# Patient Record
Sex: Female | Born: 1960 | Race: Black or African American | Hispanic: No | Smoking: Never smoker
Health system: Southern US, Community
[De-identification: ages and names within clinical notes are randomized; demographics above are authoritative.]

## PROBLEM LIST (undated history)

## (undated) DIAGNOSIS — D259 Leiomyoma of uterus, unspecified: Secondary | ICD-10-CM

## (undated) DIAGNOSIS — E663 Overweight: Secondary | ICD-10-CM

## (undated) DIAGNOSIS — H5210 Myopia, unspecified eye: Secondary | ICD-10-CM

## (undated) DIAGNOSIS — F411 Generalized anxiety disorder: Secondary | ICD-10-CM

## (undated) DIAGNOSIS — M797 Fibromyalgia: Secondary | ICD-10-CM

## (undated) DIAGNOSIS — M5430 Sciatica, unspecified side: Secondary | ICD-10-CM

## (undated) DIAGNOSIS — L74519 Primary focal hyperhidrosis, unspecified: Secondary | ICD-10-CM

## (undated) DIAGNOSIS — L708 Other acne: Secondary | ICD-10-CM

## (undated) DIAGNOSIS — H3560 Retinal hemorrhage, unspecified eye: Secondary | ICD-10-CM

## (undated) DIAGNOSIS — I10 Essential (primary) hypertension: Secondary | ICD-10-CM

## (undated) DIAGNOSIS — F329 Major depressive disorder, single episode, unspecified: Secondary | ICD-10-CM

## (undated) DIAGNOSIS — L719 Rosacea, unspecified: Secondary | ICD-10-CM

## (undated) HISTORY — DX: Primary focal hyperhidrosis, unspecified: L74.519

## (undated) HISTORY — DX: Major depressive disorder, single episode, unspecified: F32.9

## (undated) HISTORY — DX: Overweight: E66.3

## (undated) HISTORY — DX: Essential (primary) hypertension: I10

## (undated) HISTORY — DX: Sciatica, unspecified side: M54.30

## (undated) HISTORY — DX: Other acne: L70.8

## (undated) HISTORY — DX: Leiomyoma of uterus, unspecified: D25.9

## (undated) HISTORY — DX: Myopia, unspecified eye: H52.10

## (undated) HISTORY — DX: Generalized anxiety disorder: F41.1

## (undated) HISTORY — DX: Retinal hemorrhage, unspecified eye: H35.60

## (undated) HISTORY — DX: Fibromyalgia: M79.7

## (undated) HISTORY — DX: Rosacea, unspecified: L71.9

---

## 1998-04-16 ENCOUNTER — Emergency Department (HOSPITAL_COMMUNITY): Admission: EM | Admit: 1998-04-16 | Discharge: 1998-04-16 | Payer: Self-pay | Admitting: Emergency Medicine

## 1998-10-02 ENCOUNTER — Emergency Department (HOSPITAL_COMMUNITY): Admission: EM | Admit: 1998-10-02 | Discharge: 1998-10-02 | Payer: Self-pay | Admitting: Emergency Medicine

## 1999-03-21 ENCOUNTER — Emergency Department (HOSPITAL_COMMUNITY): Admission: EM | Admit: 1999-03-21 | Discharge: 1999-03-21 | Payer: Self-pay | Admitting: Emergency Medicine

## 1999-03-21 ENCOUNTER — Encounter: Payer: Self-pay | Admitting: Emergency Medicine

## 1999-05-06 ENCOUNTER — Emergency Department (HOSPITAL_COMMUNITY): Admission: EM | Admit: 1999-05-06 | Discharge: 1999-05-06 | Payer: Self-pay | Admitting: Emergency Medicine

## 1999-06-05 ENCOUNTER — Emergency Department (HOSPITAL_COMMUNITY): Admission: EM | Admit: 1999-06-05 | Discharge: 1999-06-05 | Payer: Self-pay | Admitting: Emergency Medicine

## 1999-06-05 ENCOUNTER — Encounter: Payer: Self-pay | Admitting: Emergency Medicine

## 1999-08-19 ENCOUNTER — Emergency Department (HOSPITAL_COMMUNITY): Admission: EM | Admit: 1999-08-19 | Discharge: 1999-08-19 | Payer: Self-pay | Admitting: *Deleted

## 1999-09-10 ENCOUNTER — Emergency Department (HOSPITAL_COMMUNITY): Admission: EM | Admit: 1999-09-10 | Discharge: 1999-09-10 | Payer: Self-pay | Admitting: Emergency Medicine

## 1999-09-17 ENCOUNTER — Encounter: Payer: Self-pay | Admitting: Physical Medicine and Rehabilitation

## 1999-09-17 ENCOUNTER — Ambulatory Visit (HOSPITAL_COMMUNITY)
Admission: RE | Admit: 1999-09-17 | Discharge: 1999-09-17 | Payer: Self-pay | Admitting: Physical Medicine and Rehabilitation

## 1999-11-02 ENCOUNTER — Emergency Department (HOSPITAL_COMMUNITY): Admission: EM | Admit: 1999-11-02 | Discharge: 1999-11-02 | Payer: Self-pay | Admitting: Emergency Medicine

## 2000-02-09 ENCOUNTER — Emergency Department (HOSPITAL_COMMUNITY): Admission: EM | Admit: 2000-02-09 | Discharge: 2000-02-09 | Payer: Self-pay | Admitting: *Deleted

## 2000-03-19 ENCOUNTER — Emergency Department (HOSPITAL_COMMUNITY): Admission: EM | Admit: 2000-03-19 | Discharge: 2000-03-19 | Payer: Self-pay | Admitting: Emergency Medicine

## 2000-05-13 ENCOUNTER — Emergency Department (HOSPITAL_COMMUNITY): Admission: EM | Admit: 2000-05-13 | Discharge: 2000-05-13 | Payer: Self-pay | Admitting: Emergency Medicine

## 2002-03-16 ENCOUNTER — Emergency Department (HOSPITAL_COMMUNITY): Admission: EM | Admit: 2002-03-16 | Discharge: 2002-03-16 | Payer: Self-pay | Admitting: Emergency Medicine

## 2002-07-27 ENCOUNTER — Emergency Department (HOSPITAL_COMMUNITY): Admission: EM | Admit: 2002-07-27 | Discharge: 2002-07-27 | Payer: Self-pay

## 2002-09-27 ENCOUNTER — Emergency Department (HOSPITAL_COMMUNITY): Admission: EM | Admit: 2002-09-27 | Discharge: 2002-09-28 | Payer: Self-pay | Admitting: Emergency Medicine

## 2003-01-11 ENCOUNTER — Encounter: Admission: RE | Admit: 2003-01-11 | Discharge: 2003-02-13 | Payer: Self-pay | Admitting: Occupational Medicine

## 2003-02-09 ENCOUNTER — Emergency Department (HOSPITAL_COMMUNITY): Admission: EM | Admit: 2003-02-09 | Discharge: 2003-02-09 | Payer: Self-pay | Admitting: Emergency Medicine

## 2005-05-13 ENCOUNTER — Ambulatory Visit (HOSPITAL_COMMUNITY): Admission: RE | Admit: 2005-05-13 | Discharge: 2005-05-13 | Payer: Self-pay | Admitting: Internal Medicine

## 2005-05-13 ENCOUNTER — Ambulatory Visit: Payer: Self-pay | Admitting: Internal Medicine

## 2005-06-02 ENCOUNTER — Ambulatory Visit (HOSPITAL_COMMUNITY): Admission: RE | Admit: 2005-06-02 | Discharge: 2005-06-02 | Payer: Self-pay | Admitting: Internal Medicine

## 2005-07-30 ENCOUNTER — Ambulatory Visit: Payer: Self-pay | Admitting: Internal Medicine

## 2006-03-05 ENCOUNTER — Ambulatory Visit: Payer: Self-pay | Admitting: Internal Medicine

## 2006-03-05 ENCOUNTER — Encounter (INDEPENDENT_AMBULATORY_CARE_PROVIDER_SITE_OTHER): Payer: Self-pay | Admitting: Internal Medicine

## 2006-08-09 ENCOUNTER — Encounter (INDEPENDENT_AMBULATORY_CARE_PROVIDER_SITE_OTHER): Payer: Self-pay | Admitting: Internal Medicine

## 2006-08-09 DIAGNOSIS — H5210 Myopia, unspecified eye: Secondary | ICD-10-CM

## 2006-08-09 DIAGNOSIS — L719 Rosacea, unspecified: Secondary | ICD-10-CM | POA: Insufficient documentation

## 2006-08-09 DIAGNOSIS — L708 Other acne: Secondary | ICD-10-CM

## 2006-08-09 DIAGNOSIS — D259 Leiomyoma of uterus, unspecified: Secondary | ICD-10-CM

## 2006-08-09 HISTORY — DX: Myopia, unspecified eye: H52.10

## 2006-08-09 HISTORY — DX: Rosacea, unspecified: L71.9

## 2006-08-09 HISTORY — DX: Other acne: L70.8

## 2006-08-18 ENCOUNTER — Encounter (INDEPENDENT_AMBULATORY_CARE_PROVIDER_SITE_OTHER): Payer: Self-pay | Admitting: Internal Medicine

## 2006-08-18 ENCOUNTER — Ambulatory Visit: Payer: Self-pay | Admitting: Hospitalist

## 2006-08-18 LAB — CONVERTED CEMR LAB
BUN: 8 mg/dL (ref 6–23)
CO2: 27 meq/L (ref 19–32)
Calcium: 9 mg/dL (ref 8.4–10.5)
Chloride: 105 meq/L (ref 96–112)
Creatinine, Ser: 0.92 mg/dL (ref 0.40–1.20)
FSH: 36 milliintl units/mL
Free T4: 0.87 ng/dL — ABNORMAL LOW (ref 0.89–1.80)
Hemoglobin: 13.5 g/dL (ref 11.7–14.8)
LH: 30.3 milliintl units/mL
Lymphs Abs: 1.6 10*3/uL (ref 0.8–3.1)
MCV: 86 fL (ref 78.8–100.0)
Monocytes Absolute: 0.4 10*3/uL (ref 0.2–0.7)
Monocytes Relative: 7 % (ref 3–11)
Neutro Abs: 4.3 10*3/uL (ref 1.8–6.8)
Neutrophils Relative %: 65 % (ref 47–77)
RBC: 4.72 M/uL (ref 3.79–4.96)
TSH: 1.078 microintl units/mL (ref 0.350–5.50)
WBC: 6.5 10*3/uL (ref 3.7–10.0)

## 2006-08-23 ENCOUNTER — Ambulatory Visit: Payer: Self-pay | Admitting: Internal Medicine

## 2006-09-22 DIAGNOSIS — L74519 Primary focal hyperhidrosis, unspecified: Secondary | ICD-10-CM

## 2006-09-22 HISTORY — DX: Primary focal hyperhidrosis, unspecified: L74.519

## 2006-09-29 ENCOUNTER — Ambulatory Visit (HOSPITAL_COMMUNITY): Admission: RE | Admit: 2006-09-29 | Discharge: 2006-09-29 | Payer: Self-pay | Admitting: Obstetrics and Gynecology

## 2006-09-29 ENCOUNTER — Encounter (INDEPENDENT_AMBULATORY_CARE_PROVIDER_SITE_OTHER): Payer: Self-pay | Admitting: Internal Medicine

## 2007-03-24 ENCOUNTER — Encounter (INDEPENDENT_AMBULATORY_CARE_PROVIDER_SITE_OTHER): Payer: Self-pay | Admitting: Internal Medicine

## 2007-03-24 ENCOUNTER — Ambulatory Visit: Payer: Self-pay | Admitting: Internal Medicine

## 2007-03-24 LAB — CONVERTED CEMR LAB

## 2007-04-12 ENCOUNTER — Ambulatory Visit: Payer: Self-pay | Admitting: Internal Medicine

## 2007-04-12 DIAGNOSIS — I10 Essential (primary) hypertension: Secondary | ICD-10-CM | POA: Insufficient documentation

## 2007-04-13 ENCOUNTER — Encounter (INDEPENDENT_AMBULATORY_CARE_PROVIDER_SITE_OTHER): Payer: Self-pay | Admitting: Internal Medicine

## 2007-04-15 ENCOUNTER — Encounter (INDEPENDENT_AMBULATORY_CARE_PROVIDER_SITE_OTHER): Payer: Self-pay | Admitting: Internal Medicine

## 2007-07-18 ENCOUNTER — Encounter (INDEPENDENT_AMBULATORY_CARE_PROVIDER_SITE_OTHER): Payer: Self-pay | Admitting: Internal Medicine

## 2007-07-18 ENCOUNTER — Ambulatory Visit (HOSPITAL_COMMUNITY): Admission: RE | Admit: 2007-07-18 | Discharge: 2007-07-18 | Payer: Self-pay | Admitting: Infectious Diseases

## 2007-07-18 ENCOUNTER — Ambulatory Visit: Payer: Self-pay | Admitting: Infectious Diseases

## 2007-07-18 DIAGNOSIS — M5430 Sciatica, unspecified side: Secondary | ICD-10-CM

## 2007-07-18 HISTORY — DX: Sciatica, unspecified side: M54.30

## 2007-08-29 ENCOUNTER — Emergency Department (HOSPITAL_COMMUNITY): Admission: EM | Admit: 2007-08-29 | Discharge: 2007-08-29 | Payer: Self-pay | Admitting: Family Medicine

## 2007-12-21 ENCOUNTER — Encounter (INDEPENDENT_AMBULATORY_CARE_PROVIDER_SITE_OTHER): Payer: Self-pay | Admitting: Internal Medicine

## 2007-12-21 ENCOUNTER — Ambulatory Visit: Payer: Self-pay | Admitting: *Deleted

## 2007-12-21 LAB — CONVERTED CEMR LAB
ALT: 9 units/L (ref 0–35)
AST: 15 units/L (ref 0–37)
Alkaline Phosphatase: 78 units/L (ref 39–117)
Cholesterol: 186 mg/dL (ref 0–200)
Creatinine, Ser: 0.96 mg/dL (ref 0.40–1.20)
Total Bilirubin: 0.5 mg/dL (ref 0.3–1.2)
Total CHOL/HDL Ratio: 2.9
VLDL: 12 mg/dL (ref 0–40)

## 2007-12-22 ENCOUNTER — Ambulatory Visit (HOSPITAL_COMMUNITY): Admission: RE | Admit: 2007-12-22 | Discharge: 2007-12-22 | Payer: Self-pay | Admitting: *Deleted

## 2008-01-10 IMAGING — CR DG LUMBAR SPINE COMPLETE 4+V
5 series · 5 of 5 positions shown · non-contrast
Comparison: None.

CLINICAL DATA: Low back pain.
 LUMBAR SPINE - 4 VIEW:

[t l-spine a.p.]
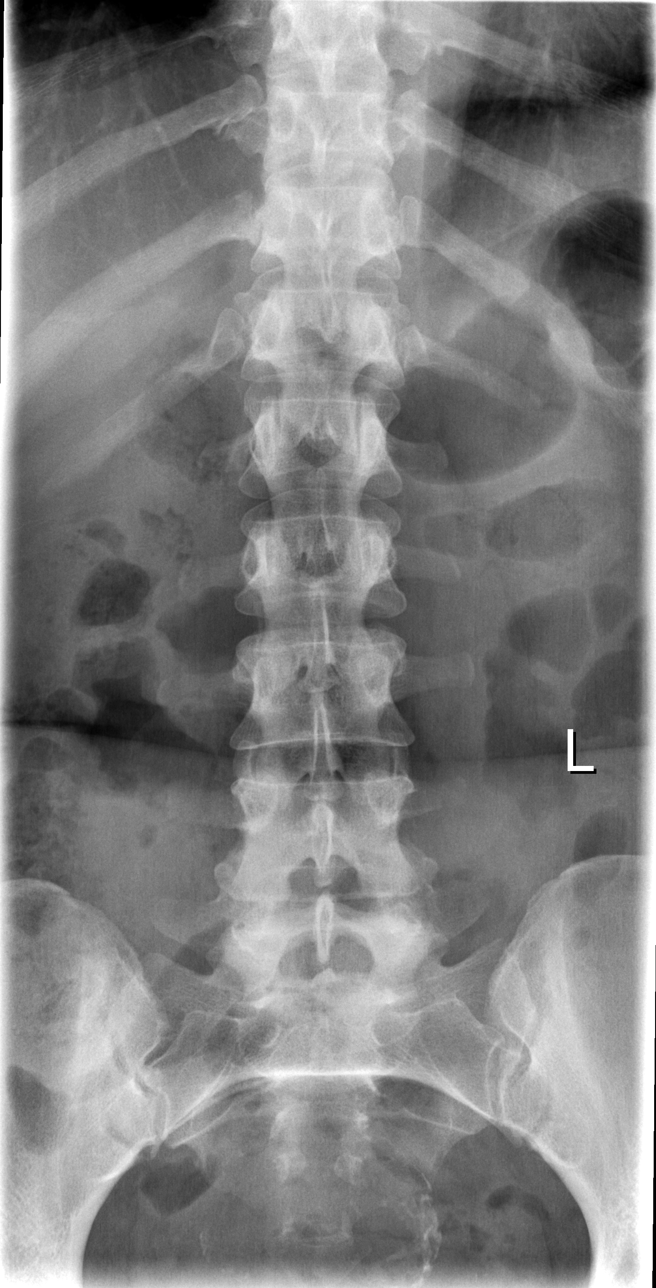

[t l-spine oblique exposure (1 of 2)]
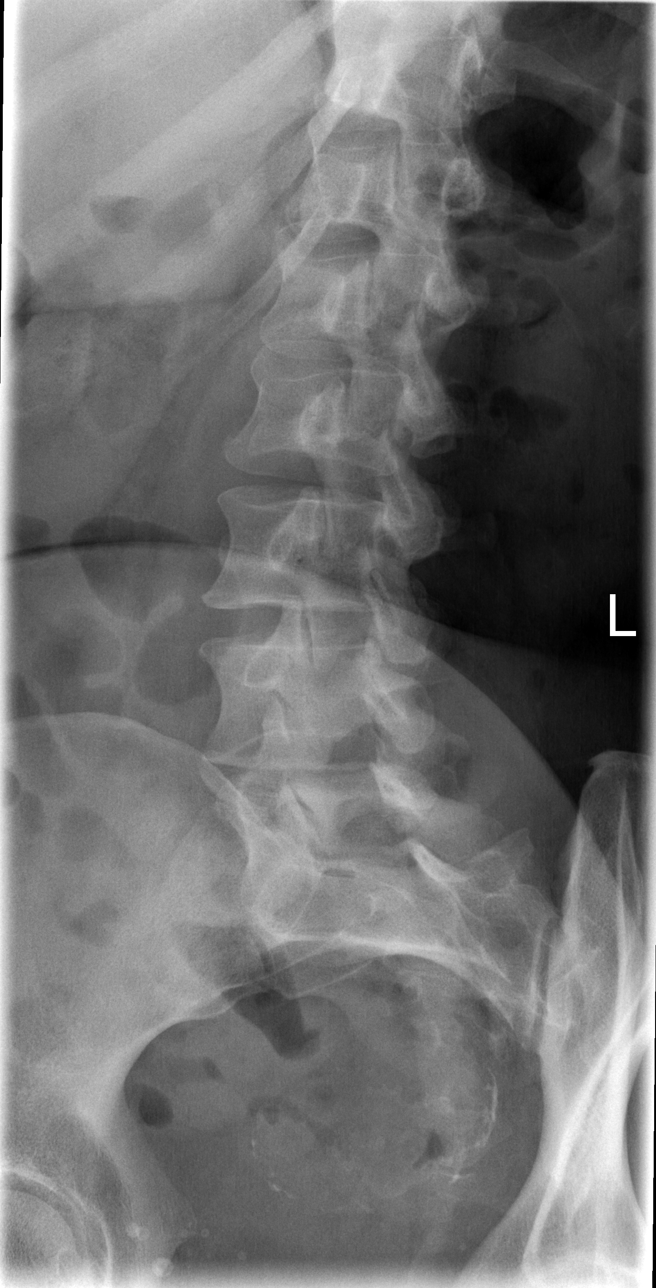

[t l-spine oblique exposure (2 of 2)]
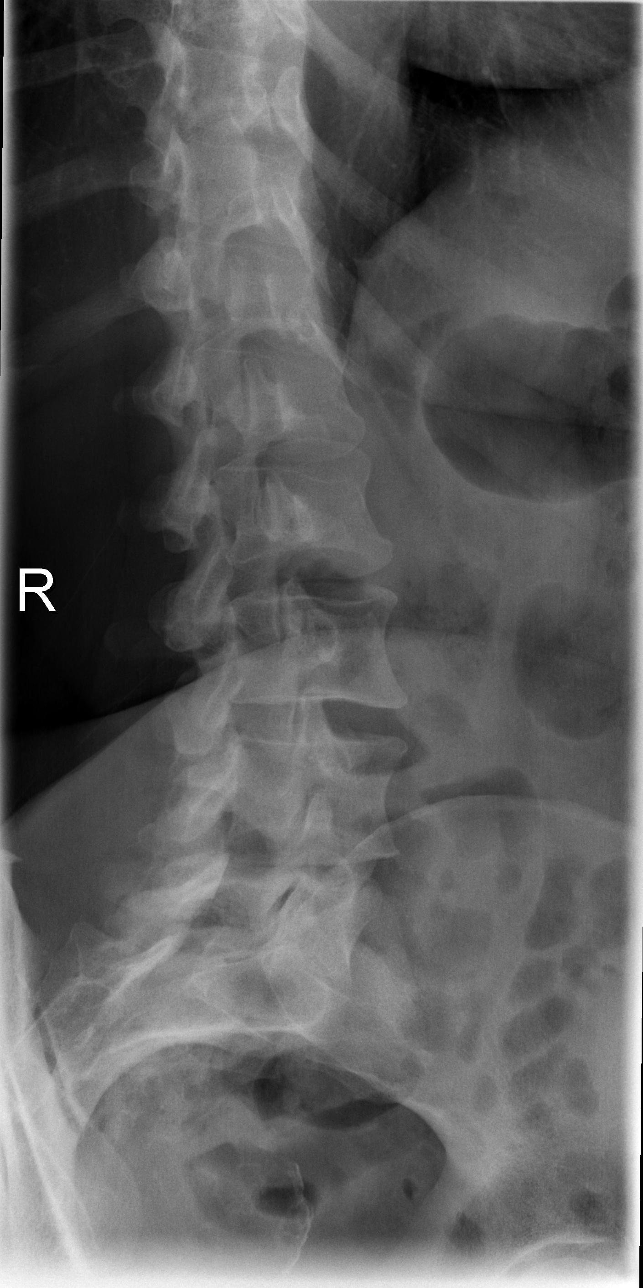

[t l-spine lat]
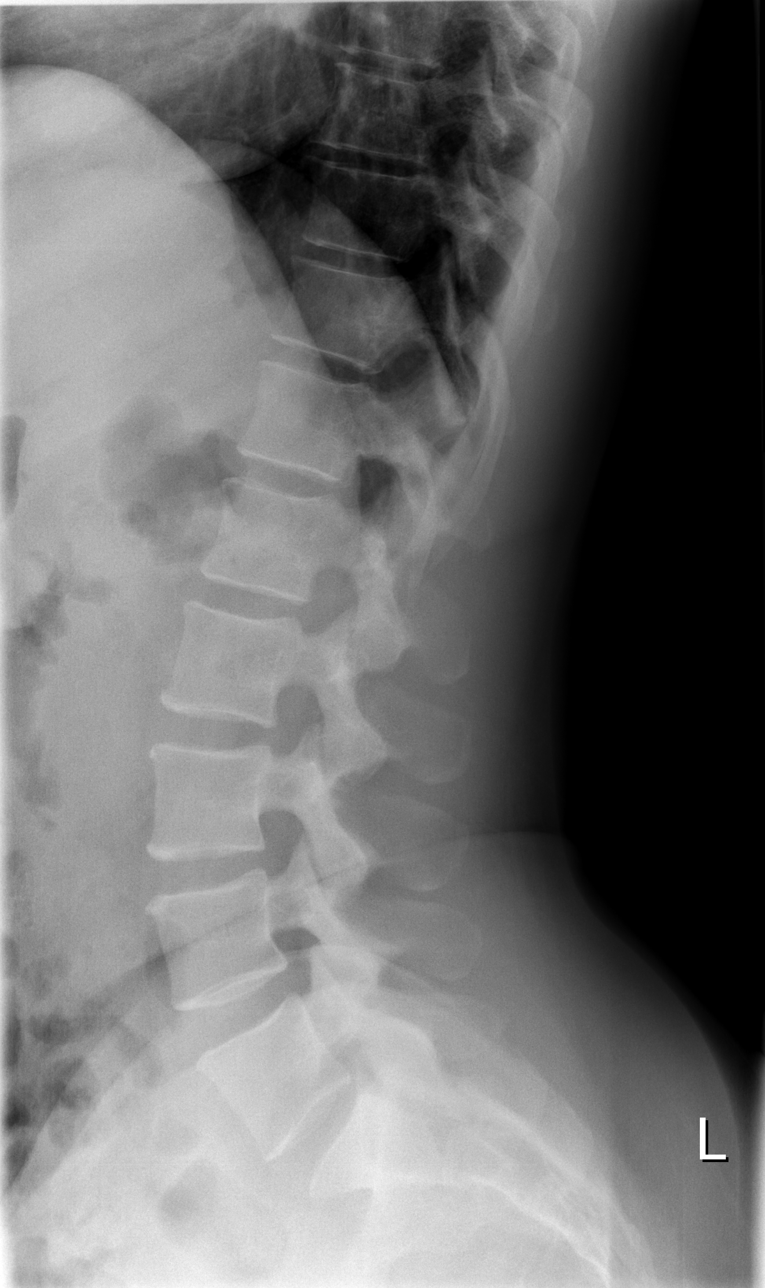

[t l-spine l5-s1 spot]
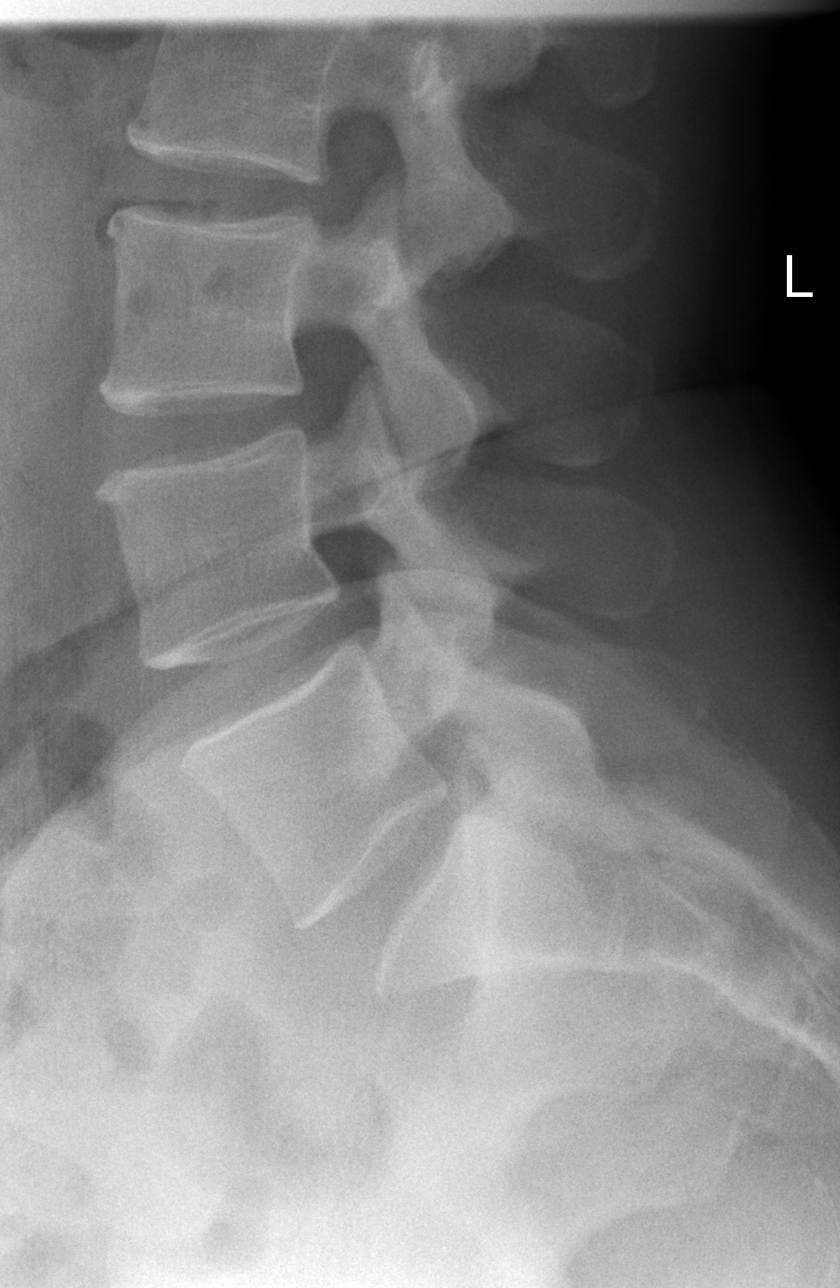

[5 of 5 positions shown; findings below may reference images not displayed]

FINDINGS: Alignment is anatomic. Vertebral body and disc space height are maintained. No significant degenerative changes.
IMPRESSION: No acute findings.

## 2008-01-12 ENCOUNTER — Ambulatory Visit (HOSPITAL_COMMUNITY): Admission: RE | Admit: 2008-01-12 | Discharge: 2008-01-12 | Payer: Self-pay | Admitting: *Deleted

## 2008-01-12 ENCOUNTER — Ambulatory Visit (HOSPITAL_COMMUNITY): Admission: RE | Admit: 2008-01-12 | Discharge: 2008-01-12 | Payer: Self-pay | Admitting: Internal Medicine

## 2008-02-08 ENCOUNTER — Ambulatory Visit: Payer: Self-pay | Admitting: *Deleted

## 2008-04-12 ENCOUNTER — Ambulatory Visit: Payer: Self-pay | Admitting: Internal Medicine

## 2008-04-12 ENCOUNTER — Encounter (INDEPENDENT_AMBULATORY_CARE_PROVIDER_SITE_OTHER): Payer: Self-pay | Admitting: Internal Medicine

## 2008-04-12 DIAGNOSIS — R635 Abnormal weight gain: Secondary | ICD-10-CM | POA: Insufficient documentation

## 2008-04-12 LAB — CONVERTED CEMR LAB: Candida species: NEGATIVE

## 2008-04-16 ENCOUNTER — Telehealth (INDEPENDENT_AMBULATORY_CARE_PROVIDER_SITE_OTHER): Payer: Self-pay | Admitting: Internal Medicine

## 2008-04-18 ENCOUNTER — Encounter: Payer: Self-pay | Admitting: Internal Medicine

## 2008-04-18 ENCOUNTER — Ambulatory Visit: Payer: Self-pay | Admitting: Internal Medicine

## 2008-04-18 LAB — CONVERTED CEMR LAB
Rubella: 47.4 intl units/mL — ABNORMAL HIGH
Varicella-Zoster Ab, IgM: <0.91 (ref <0.91)

## 2008-04-26 LAB — CONVERTED CEMR LAB: GC Probe Amp, Genital: NEGATIVE

## 2008-08-09 ENCOUNTER — Ambulatory Visit (HOSPITAL_COMMUNITY): Admission: RE | Admit: 2008-08-09 | Discharge: 2008-08-09 | Payer: Self-pay | Admitting: Internal Medicine

## 2008-08-09 ENCOUNTER — Encounter (INDEPENDENT_AMBULATORY_CARE_PROVIDER_SITE_OTHER): Payer: Self-pay | Admitting: Internal Medicine

## 2008-08-09 ENCOUNTER — Ambulatory Visit: Payer: Self-pay | Admitting: Internal Medicine

## 2008-08-09 DIAGNOSIS — IMO0001 Reserved for inherently not codable concepts without codable children: Secondary | ICD-10-CM

## 2008-08-09 LAB — CONVERTED CEMR LAB
BUN: 12 mg/dL (ref 6–23)
Bilirubin Urine: NEGATIVE
CO2: 24 meq/L (ref 19–32)
Calcium: 9.4 mg/dL (ref 8.4–10.5)
Creatinine, Ser: 1 mg/dL (ref 0.40–1.20)
Glucose, Bld: 88 mg/dL (ref 70–99)
Hemoglobin, Urine: NEGATIVE
Hemoglobin: 13.9 g/dL (ref 12.0–15.0)
Ketones, ur: NEGATIVE mg/dL
MCHC: 34.3 g/dL (ref 30.0–36.0)
MCV: 83.9 fL (ref 78.0–100.0)
Microalb, Ur: 0.27 mg/dL (ref 0.00–1.89)
Protein, ur: NEGATIVE mg/dL
RBC: 4.83 M/uL (ref 3.87–5.11)
Sed Rate: 8 mm/hr (ref 0–22)
Urine Glucose: NEGATIVE mg/dL

## 2008-08-14 ENCOUNTER — Emergency Department (HOSPITAL_BASED_OUTPATIENT_CLINIC_OR_DEPARTMENT_OTHER): Admission: EM | Admit: 2008-08-14 | Discharge: 2008-08-14 | Payer: Self-pay | Admitting: Emergency Medicine

## 2008-08-29 ENCOUNTER — Encounter (INDEPENDENT_AMBULATORY_CARE_PROVIDER_SITE_OTHER): Payer: Self-pay | Admitting: Internal Medicine

## 2008-08-29 ENCOUNTER — Ambulatory Visit: Payer: Self-pay | Admitting: Internal Medicine

## 2008-08-29 LAB — CONVERTED CEMR LAB
CO2: 28 meq/L (ref 19–32)
Calcium: 9.2 mg/dL (ref 8.4–10.5)
Creatinine, Ser: 1.1 mg/dL (ref 0.40–1.20)
Glucose, Bld: 96 mg/dL (ref 70–99)

## 2008-08-31 ENCOUNTER — Telehealth (INDEPENDENT_AMBULATORY_CARE_PROVIDER_SITE_OTHER): Payer: Self-pay | Admitting: Internal Medicine

## 2008-11-30 ENCOUNTER — Encounter (INDEPENDENT_AMBULATORY_CARE_PROVIDER_SITE_OTHER): Payer: Self-pay | Admitting: Internal Medicine

## 2009-01-02 ENCOUNTER — Ambulatory Visit: Payer: Self-pay | Admitting: Internal Medicine

## 2009-01-15 ENCOUNTER — Ambulatory Visit (HOSPITAL_COMMUNITY): Admission: RE | Admit: 2009-01-15 | Discharge: 2009-01-15 | Payer: Self-pay | Admitting: Internal Medicine

## 2009-03-06 ENCOUNTER — Telehealth: Payer: Self-pay | Admitting: *Deleted

## 2009-04-15 ENCOUNTER — Ambulatory Visit (HOSPITAL_COMMUNITY): Admission: RE | Admit: 2009-04-15 | Discharge: 2009-04-15 | Payer: Self-pay | Admitting: Specialist

## 2009-05-29 ENCOUNTER — Telehealth (INDEPENDENT_AMBULATORY_CARE_PROVIDER_SITE_OTHER): Payer: Self-pay | Admitting: Internal Medicine

## 2009-06-06 ENCOUNTER — Telehealth (INDEPENDENT_AMBULATORY_CARE_PROVIDER_SITE_OTHER): Payer: Self-pay | Admitting: Internal Medicine

## 2009-08-21 ENCOUNTER — Ambulatory Visit: Payer: Self-pay | Admitting: Infectious Disease

## 2009-08-21 DIAGNOSIS — M542 Cervicalgia: Secondary | ICD-10-CM

## 2009-10-01 ENCOUNTER — Telehealth (INDEPENDENT_AMBULATORY_CARE_PROVIDER_SITE_OTHER): Payer: Self-pay | Admitting: Internal Medicine

## 2009-10-11 ENCOUNTER — Telehealth (INDEPENDENT_AMBULATORY_CARE_PROVIDER_SITE_OTHER): Payer: Self-pay | Admitting: Internal Medicine

## 2009-10-23 ENCOUNTER — Ambulatory Visit: Payer: Self-pay | Admitting: Internal Medicine

## 2009-10-23 DIAGNOSIS — R300 Dysuria: Secondary | ICD-10-CM

## 2009-10-23 DIAGNOSIS — E663 Overweight: Secondary | ICD-10-CM | POA: Insufficient documentation

## 2009-10-23 LAB — CONVERTED CEMR LAB
Bilirubin Urine: NEGATIVE
Nitrite: NEGATIVE
Protein, U semiquant: NEGATIVE
Specific Gravity, Urine: 1.02

## 2009-10-24 LAB — CONVERTED CEMR LAB
CO2: 26 meq/L (ref 19–32)
Calcium: 9.2 mg/dL (ref 8.4–10.5)
Casts: NONE SEEN /lpf
Chloride: 107 meq/L (ref 96–112)
Creatinine, Ser: 1.01 mg/dL (ref 0.40–1.20)
Glucose, Bld: 84 mg/dL (ref 70–99)
Hemoglobin, Urine: NEGATIVE
Ketones, ur: NEGATIVE mg/dL
Nitrite: POSITIVE — AB
Protein, ur: NEGATIVE mg/dL
Sodium: 141 meq/L (ref 135–145)
Total Bilirubin: 0.3 mg/dL (ref 0.3–1.2)
Total Protein: 7.3 g/dL (ref 6.0–8.3)
Urobilinogen, UA: 0.2 (ref 0.0–1.0)
pH: 5.5 (ref 5.0–8.0)

## 2009-10-28 ENCOUNTER — Encounter (INDEPENDENT_AMBULATORY_CARE_PROVIDER_SITE_OTHER): Payer: Self-pay | Admitting: Internal Medicine

## 2009-12-03 ENCOUNTER — Ambulatory Visit (HOSPITAL_COMMUNITY): Admission: RE | Admit: 2009-12-03 | Discharge: 2009-12-03 | Payer: Self-pay | Admitting: Gastroenterology

## 2009-12-10 ENCOUNTER — Encounter (INDEPENDENT_AMBULATORY_CARE_PROVIDER_SITE_OTHER): Payer: Self-pay | Admitting: Internal Medicine

## 2010-01-01 ENCOUNTER — Telehealth: Payer: Self-pay

## 2010-01-31 ENCOUNTER — Encounter
Admission: RE | Admit: 2010-01-31 | Discharge: 2010-05-01 | Payer: Self-pay | Admitting: Physical Medicine & Rehabilitation

## 2010-02-04 ENCOUNTER — Encounter (INDEPENDENT_AMBULATORY_CARE_PROVIDER_SITE_OTHER): Payer: Self-pay | Admitting: Internal Medicine

## 2010-02-05 ENCOUNTER — Encounter (INDEPENDENT_AMBULATORY_CARE_PROVIDER_SITE_OTHER): Payer: Self-pay | Admitting: Internal Medicine

## 2010-02-06 ENCOUNTER — Ambulatory Visit (HOSPITAL_COMMUNITY): Admission: RE | Admit: 2010-02-06 | Discharge: 2010-02-06 | Payer: Self-pay | Admitting: Internal Medicine

## 2010-03-18 ENCOUNTER — Ambulatory Visit: Payer: Self-pay | Admitting: Physical Medicine & Rehabilitation

## 2010-06-10 ENCOUNTER — Ambulatory Visit: Payer: Self-pay | Admitting: Internal Medicine

## 2010-06-10 DIAGNOSIS — H5710 Ocular pain, unspecified eye: Secondary | ICD-10-CM

## 2010-06-10 DIAGNOSIS — R1011 Right upper quadrant pain: Secondary | ICD-10-CM

## 2010-06-10 LAB — CONVERTED CEMR LAB
ALT: 11 units/L (ref 0–35)
AST: 18 units/L (ref 0–37)
Albumin: 4.7 g/dL (ref 3.5–5.2)
Alkaline Phosphatase: 71 units/L (ref 39–117)
BUN: 15 mg/dL (ref 6–23)
Basophils Absolute: 0.1 10*3/uL (ref 0.0–0.1)
Basophils Relative: 1 % (ref 0–1)
Blood in Urine, dipstick: NEGATIVE
Calcium: 9.2 mg/dL (ref 8.4–10.5)
Chloride: 106 meq/L (ref 96–112)
Cholesterol: 187 mg/dL (ref 0–200)
Eosinophils Relative: 3 % (ref 0–5)
Glucose, Urine, Semiquant: NEGATIVE
HDL: 49 mg/dL (ref >39)
Hemoglobin: 13.7 g/dL (ref 12.0–15.0)
Hgb A1c MFr Bld: 5.7 %
Ketones, urine, test strip: NEGATIVE
Lymphocytes Relative: 27 % (ref 12–46)
MCHC: 33.8 g/dL (ref 30.0–36.0)
Monocytes Absolute: 0.4 10*3/uL (ref 0.1–1.0)
Neutro Abs: 4.5 10*3/uL (ref 1.7–7.7)
Platelets: 242 10*3/uL (ref 150–400)
Potassium: 4 meq/L (ref 3.5–5.3)
Protein, U semiquant: NEGATIVE
RDW: 13.1 % (ref 11.5–15.5)
Sodium: 142 meq/L (ref 135–145)
Total CHOL/HDL Ratio: 3.8
Urobilinogen, UA: 2
WBC Urine, dipstick: NEGATIVE

## 2010-06-13 ENCOUNTER — Ambulatory Visit (HOSPITAL_COMMUNITY): Admission: RE | Admit: 2010-06-13 | Discharge: 2010-06-13 | Payer: Self-pay | Admitting: Internal Medicine

## 2010-09-18 ENCOUNTER — Telehealth: Payer: Self-pay | Admitting: Internal Medicine

## 2010-10-09 ENCOUNTER — Telehealth: Payer: Self-pay | Admitting: Internal Medicine

## 2010-10-12 ENCOUNTER — Encounter: Payer: Self-pay | Admitting: Internal Medicine

## 2010-10-16 ENCOUNTER — Encounter: Payer: Self-pay | Admitting: Internal Medicine

## 2010-10-21 ENCOUNTER — Ambulatory Visit: Admission: RE | Admit: 2010-10-21 | Discharge: 2010-10-21 | Payer: Self-pay | Source: Home / Self Care

## 2010-10-21 DIAGNOSIS — H3560 Retinal hemorrhage, unspecified eye: Secondary | ICD-10-CM | POA: Insufficient documentation

## 2010-10-21 DIAGNOSIS — F411 Generalized anxiety disorder: Secondary | ICD-10-CM | POA: Insufficient documentation

## 2010-10-21 HISTORY — DX: Generalized anxiety disorder: F41.1

## 2010-10-21 NOTE — Letter (Signed)
Summary: [facility]-no showed  [facility]   Imported By: Margie Billet 02/04/2010 14:14:14  _____________________________________________________________________  External Attachment:    Type:   Image     Comment:   External Document

## 2010-10-21 NOTE — Assessment & Plan Note (Signed)
Summary: f/u ED, hypertension/pcp-bowers/hla   Vital Signs:  Patient profile:   50 year old female Height:      68 inches (172.72 cm) Weight:      190.04 pounds (86.38 kg) BMI:     29.00 Temp:     98 degrees F (36.67 degrees C) oral Pulse rate:   53 / minute BP sitting:   152 / 96  (right arm)  Vitals Entered By: Angelina Ok RN (June 10, 2010 2:22 PM) CC: Depression Is Patient Diabetic? No Pain Assessment Patient in pain? no      Nutritional Status BMI of 25 - 29 = overweight  Have you ever been in a relationship where you felt threatened, hurt or afraid?No  Comments Sharp pains in upper abdomen.  Ocassional after she eats.  Montly on right.  Goes from right to left.   Primary Care Provider:  Whitney Post MD  CC:  Depression.  History of Present Illness: 50 y/o woman with PMH of HTN and fibromyalgia comes to the clinic complaining of multiple propblems.   1. Abd pain- RUQ, more when she moves, some assoicatiation with food but she cannot be sure, sharp, last 5-10 minuts, resolves on its own, no medications, associated with nasuea, no vomiting, fever. GB still in place. no h/o GI surgery, no excessive NSAIDS use, no blood in stool or hemetemeis.   2. Eye pain- left eye, episodic, dull pain associated with headache and some tearing. no redness, no h/o glaucoma, no vision changes, no family h/o glaucoma. had a regualr eye check up 1 yr ago, the pain has been going on for few months,. no complaints on the other eye.   3. High BP- BP is running in 150-160 when she checks it herself.   Depression History:      The patient is having a depressed mood most of the day but denies diminished interest in her usual daily activities.        The patient denies that she feels like life is not worth living, denies that she wishes that she were dead, and denies that she has thought about ending her life.         Preventive Screening-Counseling & Management  Alcohol-Tobacco  Smoking Status: never     Passive Smoke Exposure: no  Current Medications (verified): 1)  Flexeril 10 Mg  Tabs (Cyclobenzaprine Hcl) .... Take One Tab Every 8 Hours As Needed For Muscle Spasm For Two Weeks 2)  Tylenol Extra Strength 500 Mg Tabs (Acetaminophen) .... Take 1 Tablet Every 8 Hour For Pain As Needed. 3)  Amitriptyline Hcl 50 Mg Tabs (Amitriptyline Hcl) .... Take One and One Half Tab Before Bedtime. 4)  Calcium 600/vitamin D 600-400 Mg-Unit  Tabs (Calcium Carbonate-Vitamin D) .... Take One Tablet Twice Daily. 5)  Lisinopril 40 Mg Tabs (Lisinopril) .... Take 1 Tablet By Mouth Once A Day 6)  Omeprazole 40 Mg Cpdr (Omeprazole) .... Take 1 Tablet By Mouth Once A Day  Allergies (verified): 1)  ! Ultram 2)  Sulfa 3)  Vicodin  Review of Systems       The patient complains of abdominal pain.  The patient denies anorexia, fever, weight loss, weight gain, vision loss, decreased hearing, hoarseness, chest pain, syncope, dyspnea on exertion, peripheral edema, prolonged cough, headaches, hemoptysis, melena, hematochezia, severe indigestion/heartburn, hematuria, incontinence, genital sores, muscle weakness, suspicious skin lesions, transient blindness, difficulty walking, depression, unusual weight change, abnormal bleeding, enlarged lymph nodes, angioedema, breast masses, and testicular masses.  Physical Exam  General:  alert, well hydrated, some discomofort and anxierty  Head:  normocephalic and atraumatic.   Eyes:  vision grossly intact, pupils equal, pupils round, pupils reactive to light, pupils react to accomodation, corneas and lenses clear, no injection, no iris abnormalities, and no optic disk abnormalities.  vision grossly intact, pupils equal, pupils round, pupils reactive to light, pupils react to accomodation, corneas and lenses clear, no injection, no iris abnormalities, and no optic disk abnormalities.   Ears:  R ear normal and L ear normal.  R ear normal and L ear normal.     Nose:  no external deformity.  no external deformity.   Mouth:  good dentition.  good dentition.   Neck:  supple, full ROM, and no masses.  supple, full ROM, and no masses.   Lungs:  normal respiratory effort, no intercostal retractions, no accessory muscle use, and normal breath sounds.  normal respiratory effort, no intercostal retractions, no accessory muscle use, and normal breath sounds.   Heart:  normal rate, regular rhythm, no murmur, and no gallop.  normal rate, regular rhythm, no murmur, and no gallop.   Abdomen:  soft, normal bowel sounds, epigastric tenderness, and RUQ tenderness.  soft and normal bowel sounds.   Msk:  normal ROM, no joint tenderness, and no joint swelling.  normal ROM, no joint tenderness, and no joint swelling.   Pulses:  dorsalis pedis pulses normal bilaterally  Neurologic:  OrientedX3, cranial nerver 2-12 intact,strength good in all extremities, sensations normal to light touch, reflexes 2+ b/l, gait normal    Impression & Recommendations:  Problem # 1:  ABDOMINAL PAIN RIGHT UPPER QUADRANT (ICD-789.01)  dd- gallstones Vs GERD Vs fibromylagia Vs appendicitis (less likely based on physical exam) musculoskeletal (aggravated with movement)   Plan- USG abd        - CMET       - CBC ( anemia, infection)       - UA ( dipstick negative)        - protonix      - follow up or return to ED if worse pain, fever, n/v or other compaints.  Orders: T-CMP with Estimated GFR ([id number]) T-CBC w/Diff ([id number]) T-Urinalysis Dipstick only (30160FU) T-Ultrasound Abdominal, Complete (93235)  Problem # 2:  EYE PAIN, LEFT (ICD-379.91)  dd- glaucoma Vs migraines vs anxiety   Plan- opthalmololgy referal for assessment.            Orders: Ophthalmology Referral (Ophthalmology)  Problem # 3:  HYPERTENSION (ICD-401.9) Suboptimal control  Plan:  - Change lisinopril tro 40 mg - FLP, HBA1c for risk stratification ( family h/o first degree relatives with DM)   Her  updated medication list for this problem includes:    Lisinopril 40 Mg Tabs (Lisinopril) .Marland Kitchen... Take 1 tablet by mouth once a day  Orders: T-Lipid Profile ([id number]) T-Hgb A1C (in-house) (70623JS)  BP today: 152/96 Prior BP: 128/88 (10/23/2009)  Labs Reviewed: K+: 4.0 (10/23/2009) Creat: : 1.01 (10/23/2009)   Chol: 186 (12/21/2007)   HDL: 64 (12/21/2007)   LDL: 110 (12/21/2007)   TG: 61 (12/21/2007)  Complete Medication List: 1)  Flexeril 10 Mg Tabs (Cyclobenzaprine hcl) .... Take one tab every 8 hours as needed for muscle spasm for two weeks 2)  Tylenol Extra Strength 500 Mg Tabs (Acetaminophen) .... Take 1 tablet every 8 hour for pain as needed. 3)  Amitriptyline Hcl 50 Mg Tabs (Amitriptyline hcl) .... Take one and one half tab before bedtime. 4)  Calcium  600/vitamin D 600-400 Mg-unit Tabs (Calcium carbonate-vitamin d) .... Take one tablet twice daily. 5)  Lisinopril 40 Mg Tabs (Lisinopril) .... Take 1 tablet by mouth once a day 6)  Omeprazole 40 Mg Cpdr (Omeprazole) .... Take 1 tablet by mouth once a day  Patient Instructions: 1)  Please schedule a follow-up appointment in 3 months. 2)  The radilogy department will call you with an appointment for ultrasound. 3)  Come to the Emergency room if the pain gets worse or you are having fever, nausea or vomiting.  4)  See an eye doctor as soon as you can  Prescriptions: OMEPRAZOLE 40 MG CPDR (OMEPRAZOLE) Take 1 tablet by mouth once a day  #31 x 3   Entered and Authorized by:   Bethel Born MD   Signed by:   Bethel Born MD on 06/10/2010   Method used:   Electronically to        [facility] # [phone]* (retail)       [address]       [place], [place]  [address]       Ph: [phone]       Fax: [phone]   RxID:   [id number] LISINOPRIL 40 MG TABS (LISINOPRIL) Take 1 tablet by mouth once a day  #31 x 3   Entered and Authorized by:   Bethel Born MD   Signed by:   Bethel Born MD on  06/10/2010   Method used:   Electronically to        [facility] # [phone]* (retail)       [address]       [place], [place]  [address]       Ph: [phone]       Fax: [phone]   RxID:   [id number]   Prevention & Chronic Care Immunizations   Influenza vaccine: Fluvax Non-MCR  (10/23/2009)    Tetanus booster: 04/18/2008: Td    Pneumococcal vaccine: Not documented  Colorectal Screening   Hemoccult: Negative  (08/23/2006)    Colonoscopy:  Results: Normal.   (12/03/2009)   Colonoscopy action/deferral: Repeat colonoscopy in 10 years.    (12/03/2009)  Other Screening   Pap smear: NEGATIVE FOR INTRAEPITHELIAL LESIONS OR MALIGNANCY.  (08/21/2009)   Pap smear action/deferral: Ordered  (08/21/2009)   Pap smear due: 04/2009    Mammogram: ASSESSMENT: Negative - BI-RADS 1^MM DIGITAL SCREENING  (02/06/2010)   Mammogram action/deferral: Screening mammogram in 1 year.     (01/12/2008)   Mammogram due: 09/2007   Smoking status: never  (06/10/2010)  Lipids   Total Cholesterol: 186  (12/21/2007)   LDL: 110  (12/21/2007)   LDL Direct: Not documented   HDL: 64  (12/21/2007)   Triglycerides: 61  (12/21/2007)  Hypertension   Last Blood Pressure: 152 / 96  (06/10/2010)   Serum creatinine: 1.01  (10/23/2009)   Serum potassium 4.0  (10/23/2009)  Self-Management Support :    Patient will work on the following items until the next clinic visit to reach self-care goals:     Medications and monitoring: take my medicines every day, bring all of my medications to every visit  (06/10/2010)     Eating: drink diet soda or water instead of juice or soda, eat more vegetables, use fresh or frozen vegetables, eat foods that are low in salt, eat baked foods instead  of fried foods, eat fruit for snacks and desserts, limit or avoid alcohol  (06/10/2010)     Activity: take a 30 minute walk every day  (06/10/2010)    Hypertension self-management support: Written  self-care plan, Education handout, Pre-printed educational material, Resources for patients handout  (06/10/2010)   Hypertension self-care plan printed.   Hypertension education handout printed      Resource handout printed.   Process Orders Check Orders Results:     [facility]: ABN not required for this insurance Tests Sent for requisitioning (June 10, 2010 3:30 PM):     06/10/2010: [facility] -- T-CMP with Estimated GFR [[id number]] (signed)     06/10/2010: [facility] -- T-CBC w/Diff [[id number]] (signed)     06/10/2010: [facility] -- T-Lipid Profile [id number] (signed)     Laboratory Results   Urine Tests  Date/Time Recieved: 06/10/2010 15:15 GH Date/Time Reported: 06/10/2010 15:17 GH  Routine Urinalysis   Color: lt. yellow Appearance: Hazy Glucose: negative   (Normal Range: Negative) Bilirubin: negative   (Normal Range: Negative) Ketone: negative   (Normal Range: Negative) Spec. Gravity: 1.015   (Normal Range: 1.003-1.035) Blood: negative   (Normal Range: Negative) pH: 7.0   (Normal Range: 5.0-8.0) Protein: negative   (Normal Range: Negative) Urobilinogen: 2.0   (Normal Range: 0-1) Nitrite: negative   (Normal Range: Negative) Leukocyte Esterace: negative   (Normal Range: Negative)

## 2010-10-21 NOTE — Letter (Signed)
Summary: [facility]: Referral  [facility]: Referral   Imported By: Florinda Marker 11/01/2009 14:55:30  _____________________________________________________________________  External Attachment:    Type:   Image     Comment:   External Document

## 2010-10-21 NOTE — Progress Notes (Signed)
Summary: Refill/gh  Phone Note Refill Request Message from:  Patient on October 01, 2009 12:08 PM  Refills Requested: Medication #1:  LISINOPRIL 10 MG TABS Take 1 tablet by mouth once a day 3  Medication #2:  Darvocet  Method Requested: Electronic Initial call taken by: Angelina Ok RN,  October 01, 2009 12:49 PM  Follow-up for Phone Call        The ibuprofen, flexeril, and amitriptyline should help w/ pain.  She can try over the counter tylenol if she needs something else.  Follow-up by: Joaquin Courts  MD,  October 01, 2009 2:08 PM  Additional Follow-up for Phone Call Additional follow up Details #1::        Pt says that Ibuprofen "messes" with her liver and that the other medications do not work for her.  Given option to take Tylenol.  Said that she will discuss pain medication witht Dr. Andrey Campanile at her next office visit. Additional Follow-up by: Angelina Ok RN,  October 01, 2009 2:35 PM    Prescriptions: LISINOPRIL 10 MG TABS (LISINOPRIL) Take 1 tablet by mouth once a day 3  #30 x 0   Entered and Authorized by:   Joaquin Courts  MD   Signed by:   Joaquin Courts  MD on 10/01/2009   Method used:   Electronically to        [facility] # [phone]* (retail)       [address]       [place], [place]  [address]       Ph: [phone]       Fax: [phone]   RxID:   [id number]

## 2010-10-21 NOTE — Assessment & Plan Note (Signed)
Summary: est-ck/fu/meds/cfb   Vital Signs:  Patient profile:   50 year old female Height:      68 inches (172.72 cm) Weight:      184.06 pounds (83.66 kg) BMI:     28.09 Temp:     97.8 degrees F (36.56 degrees C) oral Pulse rate:   72 / minute BP sitting:   128 / 88  (left arm)  Vitals Entered By: Angelina Ok RN (October 23, 2009 8:53 AM) CC: Depression Is Patient Diabetic? No Pain Assessment Patient in pain? yes     Location: shoulder, lower back Intensity: 6 Type: sharp, aching Nutritional Status BMI of 25 - 29 = overweight  Have you ever been in a relationship where you felt threatened, hurt or afraid?No  Comments ? UTI.  Has been going to bathroom.  Has a strong small.  Has a head ans sinus infection.  Clear to yellow mucous.  Had blistrs on the side of her mouth.  Sweats and chills 7-8 days ago.  Wants a Colonoscopy.  Problems turning her neck  Has had headaches.   Primary Care Provider:  Joaquin Courts  MD  CC:  Depression.  History of Present Illness: Pt is a 50 yo female w/ past med hx below here for routine f/u.  She notes a recnet cold w/ URI symptoms and fevers but that has now gotton better.  She c/o pain all over and relates it to her fibromyalgia.  She feels like she may havfe a UTI b/c the urine has an odor and has been cloudy.  She is interested in colonoscopy screening for colon ca.   Depression History:      The patient is having a depressed mood most of the day and has a diminished interest in her usual daily activities.  Positive alarm features for a manic disorder include excessive buying sprees.        The patient denies that she feels like life is not worth living, denies that she wishes that she were dead, and denies that she has thought about ending her life.        Comments:  In pain.   Preventive Screening-Counseling & Management  Alcohol-Tobacco     Smoking Status: never     Passive Smoke Exposure: no  Current Medications (verified): 1)   Metrogel  Gel (Metronidazole Gel) .... Apply To Face At Bedtime 2)  Differin 0.1 % Gel (Adapalene) .... Apply To Face At Bedtime 3)  Nicomide 750-25-1.5-0.5 Mg Tabs (Niacinamide-Zinc-Copper-Fa) .... Take 1 Tablet By Mouth Once A Day 4)  Drysol 20 % Soln (Aluminum Chloride) .... Apply To Affected Areas At Bedtime 5)  Flexeril 10 Mg  Tabs (Cyclobenzaprine Hcl) .... Take One Tab Every 8 Hours As Needed For Muscle Spasm For Two Weeks 6)  Zyrtec Allergy 10 Mg  Tabs (Cetirizine Hcl) .... Take One Tablet By Mouth Daily. 7)  Tylenol Extra Strength 500 Mg Tabs (Acetaminophen) .... Take 1 Tablet Every 8 Hour For Pain As Needed. 8)  Amitriptyline Hcl 50 Mg Tabs (Amitriptyline Hcl) .... Take One and One Half Tab Before Bedtime. 9)  Calcium 600/vitamin D 600-400 Mg-Unit  Tabs (Calcium Carbonate-Vitamin D) .... Take One Tablet Twice Daily. 10)  Lisinopril 10 Mg Tabs (Lisinopril) .... Take 1 Tablet By Mouth Once A Day 3  Allergies (verified): 1)  ! Ultram 2)  Sulfa 3)  Vicodin  Past History:  Past Medical History: Last updated: 08/29/2008 Rosacea Acne vulgaris Uterine fibroid Hypertension diagnosed 07/08-controls  with diet and exercise Chronic back pain Fibromyalgia: negative ESR, CPK, CRP, TSH, neg ANA and ENA panel, neg RF and neg HLAB-27. Pt has also had a negative MRI of the lumbar spine in 2009.  Normal CRP and ESR 11/09. Htn diagnosed 11/09  Past Surgical History: Last updated: 03/24/2007 S/P uterine fibroid embolectomy at [facility] Tubal ligation  Social History: Last updated: 04/18/2008 Attending to phlebotomyst school. Married Never Smoked Alcohol use-no Drug use-no  Risk Factors: Smoking Status: never (10/23/2009) Passive Smoke Exposure: no (10/23/2009)  Family History: Reviewed history from 03/24/2007 and no changes required. Mother:  DM Type 2, htn, osteoporosis Father:  Htn, died from aneurysm at age 42 SIster: CHF Sister:  DM  SIster:  Htn  Social  History: Reviewed history from 04/18/2008 and no changes required. Attending to [employer]lebotomyst school. Married Never Smoked Alcohol use-no Drug use-no  Review of Systems       As per HPI.   Physical Exam  General:  alert, well hydrated, no distress.  Eyes:  anicteric Ears:  TM's normal bilaterally, no significant cerumen Nose:  mild clear rhinorrhea Mouth:  mild pharyngeal erythema Neck:  no LAD Lungs:  Normal respiratory effort, chest expands symmetrically. Lungs are clear to auscultation, no crackles or wheezes. Heart:  Normal rate and regular rhythm. S1 and S2 normal without gallop, murmur, click, rub or other extra sounds. Abdomen:  soft, non-tender, normal bowel sounds, and no distention.   Msk:  no joint swelling Extremities:  no edema. Neurologic:  gait normal   Impression & Recommendations:  Problem # 1:  DYSURIA (ICD-788.1) Has some urinary complaints and trace blood/trace leuks on dipstick.  Will f/u UA and cx.  Nothing worrisome to suggest pyelo.  Cipro x 3 days.   Orders: T-Urinalysis ([id number]) T-Culture, Urine ([id number])  Her updated medication list for this problem includes:    Ciprofloxacin Hcl 250 Mg Tabs (Ciprofloxacin hcl) .Marland Kitchen... Take 1 tablet by mouth two times a day  Problem # 2:  HYPERTENSION (ICD-401.9) BP starting to trend up.  Cont ACE inh and discussed lifestyle strategies.  BMET today.  Her updated medication list for this problem includes:    Lisinopril 10 Mg Tabs (Lisinopril) .Marland Kitchen... Take 1 tablet by mouth once a day 3  Orders: T-Comprehensive Metabolic Panel ([id number])  BP today: 128/88 Prior BP: 122/87 (08/21/2009)  Labs Reviewed: K+: 3.7 (08/29/2008) Creat: : 1.10 (08/29/2008)   Chol: 186 (12/21/2007)   HDL: 64 (12/21/2007)   LDL: 110 (12/21/2007)   TG: 61 (12/21/2007)  Problem # 3:  FIBROMYALGIA (ICD-729.1) Still w/ continued pain all over.  Plans on trying stretching to see if this helps.  Cont flexeril, amitriptyline, and  tylenol.  The following medications were removed from the medication list:    Ibuprofen 400 Mg Tabs (Ibuprofen) .Marland Kitchen... Take 1 tablet every 8 hour for pain as needed. Her updated medication list for this problem includes:    Flexeril 10 Mg Tabs (Cyclobenzaprine hcl) .Marland Kitchen... Take one tab every 8 hours as needed for muscle spasm for two weeks    Tylenol Extra Strength 500 Mg Tabs (Acetaminophen) .Marland Kitchen... Take 1 tablet every 8 hour for pain as needed.  Problem # 4:  HEALTH MAINTENANCE EXAM (ICD-V70.0) Referral for colonoscopy placed.    Orders: T-HIV Antibody  (Reflex) ([id number]) Mammogram (Screening) (Mammo)  Problem # 5:  OVERWEIGHT (ICD-278.02) Discussed weight loss strategies.  Suggested Healthy Lifestyles Class.  Complete Medication List: 1)  Metrogel Gel (Metronidazole gel) .... Apply to face at  bedtime 2)  Differin 0.1 % Gel (Adapalene) .... Apply to face at bedtime 3)  Nicomide 750-25-1.5-0.5 Mg Tabs (Niacinamide-zinc-copper-fa) .... Take 1 tablet by mouth once a day 4)  Drysol 20 % Soln (Aluminum chloride) .... Apply to affected areas at bedtime 5)  Flexeril 10 Mg Tabs (Cyclobenzaprine hcl) .... Take one tab every 8 hours as needed for muscle spasm for two weeks 6)  Zyrtec Allergy 10 Mg Tabs (Cetirizine hcl) .... Take one tablet by mouth daily. 7)  Tylenol Extra Strength 500 Mg Tabs (Acetaminophen) .... Take 1 tablet every 8 hour for pain as needed. 8)  Amitriptyline Hcl 50 Mg Tabs (Amitriptyline hcl) .... Take one and one half tab before bedtime. 9)  Calcium 600/vitamin D 600-400 Mg-unit Tabs (Calcium carbonate-vitamin d) .... Take one tablet twice daily. 10)  Lisinopril 10 Mg Tabs (Lisinopril) .... Take 1 tablet by mouth once a day 3 11)  Ciprofloxacin Hcl 250 Mg Tabs (Ciprofloxacin hcl) .... Take 1 tablet by mouth two times a day  Other Orders: Gastroenterology Referral (GI) Influenza Vaccine NON MCR ([id number])  Patient Instructions: 1)  Please make a followup in 6 months. 2)   Please get your mammogram and colonoscopy done before your next visit. 3)  Call sooner if you need anything. 4)  Think about going to the Healthy Lifestyles Class for tips on weight loss. 5)  You will be called with any abnormal labwork.  Please make sure your phone number is correct at the front desk. Prescriptions: LISINOPRIL 10 MG TABS (LISINOPRIL) Take 1 tablet by mouth once a day 3  #30 x 6   Entered and Authorized by:   Joaquin Courts  MD   Signed by:   Joaquin Courts  MD on 10/23/2009   Method used:   Electronically to        [facility] # [phone]* (retail)       [address]       [place], [place]  [address]       Ph: [phone]       Fax: [phone]   RxID:   [id number] CIPROFLOXACIN HCL 250 MG TABS (CIPROFLOXACIN HCL) Take 1 tablet by mouth two times a day  #6 x 0   Entered and Authorized by:   Joaquin Courts  MD   Signed by:   Joaquin Courts  MD on 10/23/2009   Method used:   Electronically to        [facility] # [phone]* (retail)       [address]       [place], [place]  [address]       Ph: [phone]       Fax: [phone]   RxID:   [id number]   Prevention & Chronic Care Immunizations   Influenza vaccine: Fluvax Non-MCR  (10/23/2009)    Tetanus booster: 04/18/2008: Td    Pneumococcal vaccine: Not documented  Colorectal Screening   Hemoccult: Negative  (08/23/2006)    Colonoscopy: Not documented   Colonoscopy action/deferral: GI referral  (10/23/2009)  Other Screening   Pap smear: NEGATIVE FOR INTRAEPITHELIAL LESIONS OR MALIGNANCY.  (08/21/2009)   Pap smear action/deferral: Ordered  (08/21/2009)   Pap smear due: 04/2009    Mammogram: ASSESSMENT: Negative - BI-RADS 1^MM DIGITAL SCREENING  (01/15/2009)   Mammogram action/deferral: Screening mammogram in 1 year.     (  01/12/2008)   Mammogram due: 09/2007   Smoking status: never  (10/23/2009)  Lipids   Total Cholesterol: 186   (12/21/2007)   LDL: 110  (12/21/2007)   LDL Direct: Not documented   HDL: 64  (12/21/2007)   Triglycerides: 61  (12/21/2007)  Hypertension   Last Blood Pressure: 128 / 88  (10/23/2009)   Serum creatinine: 1.10  (08/29/2008)   Serum potassium 3.7  (08/29/2008) CMP ordered     Hypertension flowsheet reviewed?: Yes   Progress toward BP goal: At goal  Self-Management Support :    Patient will work on the following items until the next clinic visit to reach self-care goals:     Medications and monitoring: take my medicines every day, bring all of my medications to every visit  (10/23/2009)     Eating: drink diet soda or water instead of juice or soda, eat more vegetables, eat foods that are low in salt  (10/23/2009)    Hypertension self-management support: Written self-care plan  (10/23/2009)   Hypertension self-care plan printed.   Nursing Instructions: Give Flu vaccine today GI referral for screening colonoscopy (see order)   Process Orders Check Orders Results:     [facility]: ABN not required for this insurance Tests Sent for requisitioning (October 24, 2009 3:25 PM):     10/23/2009: [facility] -- T-Comprehensive Metabolic Panel [[id number]] (signed)     10/23/2009: [facility] -- T-HIV Antibody  (Reflex) [[id number]] (signed)     10/23/2009: [facility] -- T-Urinalysis [[id number]] (signed)     10/23/2009: [facility] -- T-Culture, Urine [[id number]] (signed)    Laboratory Results   Urine Tests  Date/Time Recieved: 9:06 AM 10/23/2009 Date/Time Reported: 10/23/2009 9:09 AM GH  Routine Urinalysis   Color: yellow Appearance: Cloudy Glucose: negative   (Normal Range: Negative) Bilirubin: negative   (Normal Range: Negative) Ketone: negative   (Normal Range: Negative) Spec. Gravity: 1.020   (Normal Range: 1.003-1.035) Blood: trace-intact   (Normal Range: Negative) pH: 5.0   (Normal  Range: 5.0-8.0) Protein: negative   (Normal Range: Negative) Urobilinogen: 0.2   (Normal Range: 0-1) Nitrite: negative   (Normal Range: Negative) Leukocyte Esterace: small   (Normal Range: Negative)         Immunizations Administered:  Influenza Vaccine # 1:    Vaccine Type: Fluvax Non-MCR    Site: left deltoid    Mfr: [employer]is    Dose: 0.5 ml    Route: IM    Given by: Angelina Ok RN    Exp. Date: 12/2009    Lot #: [id number]    VIS given: 04/14/07 version given October 23, 2009.  Flu Vaccine Consent Questions:    Do you have a history of severe allergic reactions to this vaccine? no    Any prior history of allergic reactions to egg and/or gelatin? no    Do you have a sensitivity to the preservative Thimersol? no    Do you have a past history of Guillan-Barre Syndrome? no    Do you currently have an acute febrile illness? no    Have you ever had a severe reaction to latex? no    Vaccine information given and explained to patient? yes    Are you currently pregnant? no

## 2010-10-21 NOTE — Miscellaneous (Signed)
Summary: [facility]: Referral  [facility]: Referral   Imported By: Florinda Marker 12/16/2009 11:39:31  _____________________________________________________________________  External Attachment:    Type:   Image     Comment:   External Document

## 2010-10-21 NOTE — Progress Notes (Signed)
Summary: PAIN APPOINTMENT  Phone Note Outgoing Call   Call placed by: Filomena Jungling NT II,  January 01, 2010 11:17 AM Call placed to: Patient Action Taken: Appt scheduled Request: Send information Details for [facility]: [facility] Summary of Call: [facility] PATIENT WILL SEE DR. KIRSTEINS ON 02-04-10 AT 11:00  Initial call taken by: [facility] NT II,  January 01, 2010 11:18 AM

## 2010-10-21 NOTE — Progress Notes (Signed)
Summary: refill/ hla  Phone Note Refill Request Message from:  Patient on October 11, 2009 3:11 PM  Refills Requested: Medication #1:  LISINOPRIL 10 MG TABS Take 1 tablet by mouth once a day 3   Last Refilled: 1/11 pt has appt 10/23/2009  Initial call taken by: Marin Roberts RN,  October 11, 2009 3:11 PM    Prescriptions: LISINOPRIL 10 MG TABS (LISINOPRIL) Take 1 tablet by mouth once a day 3  #30 x 0   Entered and Authorized by:   Joaquin Courts  MD   Signed by:   Joaquin Courts  MD on 10/11/2009   Method used:   Electronically to        [facility] # [phone]* (retail)       [address]       [place], [place]  [address]       Ph: [phone]       Fax: [phone]   RxID:   [id number]

## 2010-10-21 NOTE — Letter (Signed)
Summary: MISSED EVALUATION NOTIFICATION  MISSED EVALUATION NOTIFICATION   Imported By: Margie Billet 02/12/2010 15:27:42  _____________________________________________________________________  External Attachment:    Type:   Image     Comment:   External Document

## 2010-10-23 NOTE — Progress Notes (Signed)
Summary: refill/gg  Phone Note Refill Request  on October 09, 2010 12:30 PM  Refills Requested: Medication #1:  LISINOPRIL 40 MG TABS Take 1 tablet by mouth once a day Please change Rx to lisinopril 20 mg take 2 daily # 60.  Insurance will pay for the 20 mg tabs not the 40 mg tabs.   Method Requested: Electronic Initial call taken by: Merrie Roof RN,  October 09, 2010 12:30 PM    New/Updated Medications: LISINOPRIL 20 MG TABS (LISINOPRIL) Take 2 tablets by mouth daily Prescriptions: LISINOPRIL 20 MG TABS (LISINOPRIL) Take 2 tablets by mouth daily  #60 x 11   Entered and Authorized by:   Whitney Post MD   Signed by:   Whitney Post MD on 10/10/2010   Method used:   Electronically to        [facility] # [phone]* (retail)       [address]       [place], [place]  [address]       Ph: [phone]       Fax: [phone]   RxID:   [id number]

## 2010-10-23 NOTE — Miscellaneous (Signed)
Summary: referra  Clinical Lists Changes  Orders: Added new Referral order of Ophthalmology Referral (Ophthalmology) - Signed

## 2010-10-23 NOTE — Progress Notes (Signed)
Summary: Refill/gh  Phone Note Refill Request Message from:  Patient on September 18, 2010 4:01 PM  Refills Requested: Medication #1:  LISINOPRIL 40 MG TABS Take 1 tablet by mouth once a day Last appointment and labs was 06/10/2010.  Pt is going to schedule an appointment.   Method Requested: Electronic Initial call taken by: Angelina Ok RN,  September 18, 2010 4:01 PM  Follow-up for Phone Call        Refill approved-nurse to complete Follow-up by: Julaine Fusi  DO,  September 18, 2010 4:52 PM    Prescriptions: LISINOPRIL 40 MG TABS (LISINOPRIL) Take 1 tablet by mouth once a day  #31 x 3   Entered and Authorized by:   Julaine Fusi  DO   Signed by:   Julaine Fusi  DO on 09/18/2010   Method used:   Electronically to        [facility] # [phone]* (retail)       [address]       [place], [place]  [address]       Ph: [phone]       Fax: [phone]   RxID:   [id number]

## 2010-10-24 ENCOUNTER — Telehealth: Payer: Self-pay | Admitting: *Deleted

## 2010-10-24 NOTE — Telephone Encounter (Signed)
Pt called upset re: eye appt, i called her back but got no answer. Pt called at 1700 upset, i informed her i had called and didn't get an answer she then wanted to know what was being done, i read the referral and transferred the call to dditzler who let the pt know that a referral was in the works, pt then hung up and called [facility] to get it done now because pt states she is the only one helping, i spoke w/ dr Meredith Pel and dr Aundria Rud and tried to call pt again, i got a vmail message this time and left a message. Will try to call again before leaving

## 2010-10-25 NOTE — Telephone Encounter (Signed)
i have tried to call pt again, left message again

## 2010-10-27 ENCOUNTER — Telehealth: Payer: Self-pay | Admitting: *Deleted

## 2010-10-28 NOTE — Telephone Encounter (Signed)
Reviewing chart, for another note

## 2010-10-29 NOTE — Assessment & Plan Note (Signed)
Summary: RA/NEEDS ROUTINE CHECKUP/CH   Vital Signs:  Patient profile:   50 year old female Height:      68 inches (172.72 cm) Weight:      189.5 pounds (86.14 kg) BMI:     28.92 Temp:     98.0 degrees F (36.67 degrees C) oral Pulse rate:   66 / minute BP sitting:   138 / 95  (right arm)  Vitals Entered By: Stanton Kidney Ditzler RN (October 21, 2010 3:24 PM) Is Patient Diabetic? No Pain Assessment Patient in pain? yes     Location: h/a Intensity: 2-3 Type: aching Onset of pain  this AM Nutritional Status BMI of 25 - 29 = overweight Nutritional Status Detail appetite good  Have you ever been in a relationship where you felt threatened, hurt or afraid?denies   Does patient need assistance? Functional Status Self care Ambulation Normal Comments Discuss pain and pain med. H/a from eye.   Primary Care Provider:  Whitney Post MD   History of Present Illness: 50yo W with HTN and fibromyalgia presents for follow-up of: 1. retinal hemorrhages. Patient was seen 10/14/10 by opthalmologist (Dr. Consuelo Pandy on [place]); previously referred b/c of eye pain and concern of possible glaucoma. She was told she has no evidence of glaucome but that she did have significant vascular irregularities on retina, including small retinal hemorrhages. She was told these are likely secondary to hypertension. Opthalmologist said that she needs to be seen by retinal specialist for further evaluation and possible laser treatment of vessels.  2. HTN. She takes her blood pressure periodically at home and said that SBP is usually in the 130s-140s. The worst measurement was 178/117 in Nov when she was sen in ED; lisinopril increased at that time to 40mg . She is increasingly concerned about BP b/c of retinal changes.  3. stress/anxiety. She reports significant stress/anxiety, which she attributes to being out of work for the past couple of years. She is working hard to get job interviews. Recently, she has found herself getting  aggitated and easily tearful recently. This happens as often as every other day. She also reports occassional flushing and warmth that occurs with this stress. She entered menopause a few years ago and has occassional hot flashes but says that the emotional lability has been worse recently and she relates it to stress rather than menopause.  4. Fibromyalgia. She reports ongoing fibromyalgia pain all over body. She has used flexeril in the past which helps with muscle spasms but does not completely relieve pain. She has been out of flexeril for several months.   Depression History:      The patient denies a depressed mood most of the day and a diminished interest in her usual daily activities.         Preventive Screening-Counseling & Management  Alcohol-Tobacco     Smoking Status: never     Passive Smoke Exposure: no  Caffeine-Diet-Exercise     Does Patient Exercise: yes     Times/week: 2-3  Current Medications (verified): 1)  Flexeril 10 Mg  Tabs (Cyclobenzaprine Hcl) .... Take One Tab Every 8 Hours As Needed For Muscle Spasm For Two Weeks 2)  Tylenol Extra Strength 500 Mg Tabs (Acetaminophen) .... Take 1 Tablet Every 8 Hour For Pain As Needed. 3)  Amitriptyline Hcl 50 Mg Tabs (Amitriptyline Hcl) .... Take One and One Half Tab Before Bedtime. 4)  Calcium 600/vitamin D 600-400 Mg-Unit  Tabs (Calcium Carbonate-Vitamin D) .... Take One Tablet Twice Daily. 5)  Lisinopril 20 Mg Tabs (Lisinopril) .... Take 2 Tablets By Mouth Daily 6)  Alprazolam 0.5 Mg Tabs (Alprazolam) .... Take One Tablet Up To Twice Daily As Needed For Anxiety  Allergies: 1)  ! Ultram 2)  Sulfa 3)  Vicodin  Past History:  Past Medical History: Last updated: 08/29/2008 Rosacea Acne vulgaris Uterine fibroid Hypertension diagnosed 07/08-controls with diet and exercise Chronic back pain Fibromyalgia: negative ESR, CPK, CRP, TSH, neg ANA and ENA panel, neg RF and neg HLAB-27. Pt has also had a negative MRI of the  lumbar spine in 2009.  Normal CRP and ESR 11/09. Htn diagnosed 11/09  Past Surgical History: Last updated: 03/24/2007 S/P uterine fibroid embolectomy at [facility] Tubal ligation  Family History: Last updated: 03/24/2007 Mother:  DM Type 2, htn, osteoporosis Father:  Htn, died from aneurysm at age 46 SIster: CHF Sister:  DM  SIster:  Htn  Social History: Last updated: 10/21/2010 Attended [employer]omy[employer] school. Looking for work.  Married Never Smoked Alcohol use-no Drug use-no  Social History: Attended [employer]omy[employer] school. Looking for work.  Married Never Smoked Alcohol use-no Drug use-no  Review of Systems      See HPI General:  Denies chills and fever. CV:  Denies chest pain or discomfort, lightheadness, and palpitations. Resp:  Denies cough and shortness of breath. GI:  Denies abdominal pain and change in bowel habits. MS:  Complains of joint pain and muscle aches. Neuro:  Denies numbness and tingling. Psych:  Complains of anxiety, easily tearful, and irritability; denies depression.  Physical Exam  General:  alert and cooperative to examination.   Head:  normocephalic and atraumatic.   Eyes:  vision grossly intact, pupils equal, pupils round, and pupils reactive to light.   Mouth:  pharynx pink and moist.   Neck:  supple and no masses.   Lungs:  normal breath sounds, no crackles, and no wheezes.   Heart:  normal rate, regular rhythm, no murmur, no gallop, and no rub.   Abdomen:  soft, non-tender, normal bowel sounds, and no distention.   Msk:  normal ROM, no joint swelling, no joint warmth, and no redness over joints.   Extremities:  No edema.  Neurologic:  alert & oriented X3, cranial nerves grossly intact, strength normal in all extremities, and sensation intact to light touch.   Skin:  turgor normal and no rashes.   Psych:  Oriented X3, memory intact for recent and remote, normally interactive, good eye contact, and not anxious appearing.  mildly anxious  appearing.    Impression & Recommendations:  Problem # 1:  RETINAL HEMORRHAGE (ICD-362.81)  Patient reports recent diagnosis with small retinal changes and vascular changes consistent with poorly controlled HTN. This is surprising given her history of only mild HTN. She will need referral to retinal specialist for further evaluation.   Orders: Ophthalmology Referral (Ophthalmology)  Problem # 2:  ANXIETY (ICD-300.00) Patient reports increasing anxiety and stress related to job search. This anxiety is interferring with her ability to function normally in her daily life. Will prescribe Xanax to be used as needed.   Her updated medication list for this problem includes:    Amitriptyline Hcl 50 Mg Tabs (Amitriptyline hcl) .Marland Kitchen... Take one and one half tab before bedtime.    Alprazolam 0.5 Mg Tabs (Alprazolam) .Marland Kitchen... Take one tablet up to twice daily as needed for anxiety  Problem # 3:  HYPERTENSION (ICD-401.9) BP fairly well controlled on lisinopril. Retinal findings by opthalmologist surprising given hx of mild HTN. Will  continue current management for now pending further opthalmologic workup.   Her updated medication list for this problem includes:    Lisinopril 20 Mg Tabs (Lisinopril) .Marland Kitchen... Take 2 tablets by mouth daily  Problem # 4:  FIBROMYALGIA (ICD-729.1) Tylenol and Flexeril have helped, though not completely relieved, pain in the past (she has not taken for several months). Will refill Flexeril. Patient may be a good candidate for Lyrica in the future; however, she is currently unable to afford such medications given her lack of employment and insurance.   Her updated medication list for this problem includes:    Flexeril 10 Mg Tabs (Cyclobenzaprine hcl) .Marland Kitchen... Take one tab every 8 hours as needed for muscle spasm for two weeks    Tylenol Extra Strength 500 Mg Tabs (Acetaminophen) .Marland Kitchen... Take 1 tablet every 8 hour for pain as needed.  Complete Medication List: 1)  Flexeril 10 Mg Tabs  (Cyclobenzaprine hcl) .... Take one tab every 8 hours as needed for muscle spasm for two weeks 2)  Tylenol Extra Strength 500 Mg Tabs (Acetaminophen) .... Take 1 tablet every 8 hour for pain as needed. 3)  Amitriptyline Hcl 50 Mg Tabs (Amitriptyline hcl) .... Take one and one half tab before bedtime. 4)  Calcium 600/vitamin D 600-400 Mg-unit Tabs (Calcium carbonate-vitamin d) .... Take one tablet twice daily. 5)  Lisinopril 20 Mg Tabs (Lisinopril) .... Take 2 tablets by mouth daily 6)  Alprazolam 0.5 Mg Tabs (Alprazolam) .... Take one tablet up to twice daily as needed for anxiety  Patient Instructions: 1)  Please schedule a follow-up appointment in 3-6 months. 2)  We will work on referring you to a retinal specialist.  Prescriptions: ALPRAZOLAM 0.5 MG TABS (ALPRAZOLAM) Take one tablet up to twice daily as needed for anxiety  #30 x 0   Entered and Authorized by:   Whitney Post MD   Signed by:   Whitney Post MD on 10/21/2010   Method used:   Print then Give to Patient   RxID:   [id number] AMITRIPTYLINE HCL 50 MG TABS (AMITRIPTYLINE HCL) Take one and one half tab before bedtime.  #45 x 6   Entered and Authorized by:   Whitney Post MD   Signed by:   Whitney Post MD on 10/21/2010   Method used:   Electronically to        [facility] # [phone]* (retail)       [address]       [place], [place]  [address]       Ph: [phone]       Fax: [phone]   RxID:   [id number] ALPRAZOLAM 0.5 MG TABS (ALPRAZOLAM) Take one tablet up to twice daily as needed for anxiety  #30 x 0   Entered and Authorized by:   Whitney Post MD   Signed by:   Whitney Post MD on 10/21/2010   Method used:   Print then Give to Patient   RxID:   [id number] FLEXERIL 10 MG  TABS (CYCLOBENZAPRINE HCL) take one tab every 8 hours as needed for muscle spasm for two weeks  #60 x 6   Entered and Authorized by:   Whitney Post MD   Signed by:   Whitney Post MD on 10/21/2010   Method  used:   Electronically to        [facility] # [phone]* (retail)       [phone]       [place], [place]  [address]       Ph: [phone]       Fax: [phone]   RxID:   [id number]    Orders Added: 1)  Est. Patient Level IV [[address]] 2)  Ophthalmology Referral [Ophthalmology]     Prevention & Chronic Care Immunizations   Influenza vaccine: Fluvax Non-MCR  (10/23/2009)    Tetanus booster: 04/18/2008: Td    Pneumococcal vaccine: Not documented  Colorectal Screening   Hemoccult: Negative  (08/23/2006)    Colonoscopy:  Results: Normal.   (12/03/2009)   Colonoscopy action/deferral: Repeat colonoscopy in 10 years.    (12/03/2009)  Other Screening   Pap smear: NEGATIVE FOR INTRAEPITHELIAL LESIONS OR MALIGNANCY.  (08/21/2009)   Pap smear action/deferral: Ordered  (08/21/2009)   Pap smear due: 04/2009    Mammogram: ASSESSMENT: Negative - BI-RADS 1^MM DIGITAL SCREENING  (02/06/2010)   Mammogram action/deferral: Screening mammogram in 1 year.     (01/12/2008)   Mammogram due: 09/2007   Smoking status: never  (10/21/2010)  Lipids   Total Cholesterol: 187  (06/10/2010)   LDL: 121  (06/10/2010)   LDL Direct: Not documented   HDL: 49  (06/10/2010)   Triglycerides: 83  (06/10/2010)  Hypertension   Last Blood Pressure: 138 / 95  (10/21/2010)   Serum creatinine: 1.03  (06/10/2010)   Serum potassium 4.0  (06/10/2010)    Hypertension flowsheet reviewed?: Yes   Progress toward BP goal: Improved  Self-Management Support :   Personal Goals (by the next clinic visit) :      Personal blood pressure goal: 130/80  (10/21/2010)   Patient will work on the following items until the next clinic visit to reach self-care goals:     Medications and monitoring: bring all of my medications to every visit  (10/21/2010)     Eating: drink diet soda or water instead of juice or soda, eat more vegetables, use fresh or frozen vegetables, eat foods that are  low in salt, eat baked foods instead of fried foods, eat fruit for snacks and desserts, limit or avoid alcohol  (10/21/2010)     Activity: take a 30 minute walk every day, take the stairs instead of the elevator, park at the far end of the parking lot  (10/21/2010)    Hypertension self-management support: Written self-care plan, Education handout, Resources for patients handout  (10/21/2010)   Hypertension self-care plan printed.   Hypertension education handout printed      Resource handout printed.

## 2010-12-06 IMAGING — US US ABDOMEN COMPLETE
1 series · 14 of 25 positions shown · non-contrast
Comparison: Renal ultrasound 01/12/2008

CLINICAL DATA: Right upper quadrant abdominal pain

ABDOMINAL ULTRASOUND COMPLETE

[Series 1: us abdomen complete · 0.31mm/px · 14 of 71 slices shown]
[im 1/71]
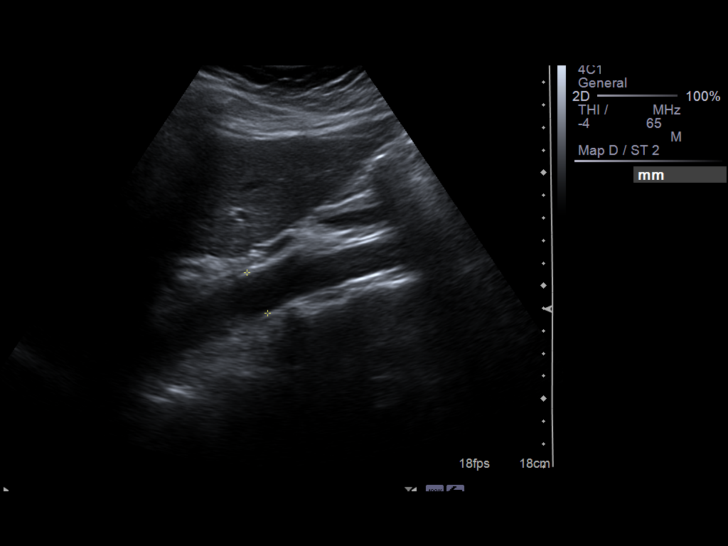
[im 6/71]
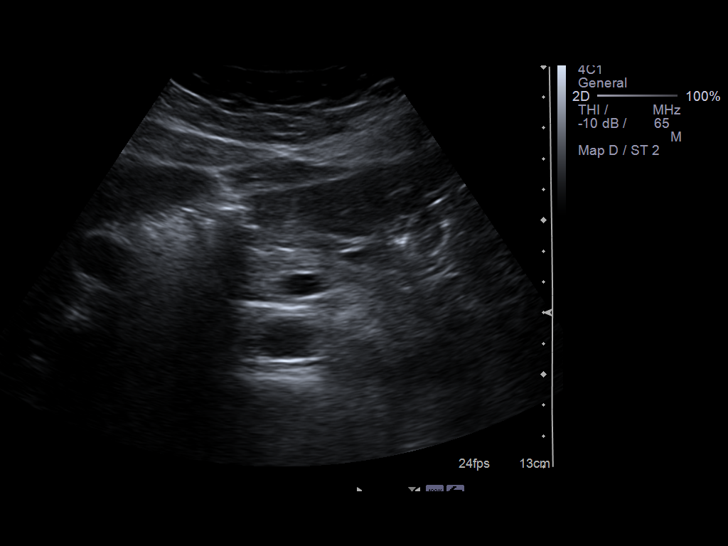
[im 12/71]
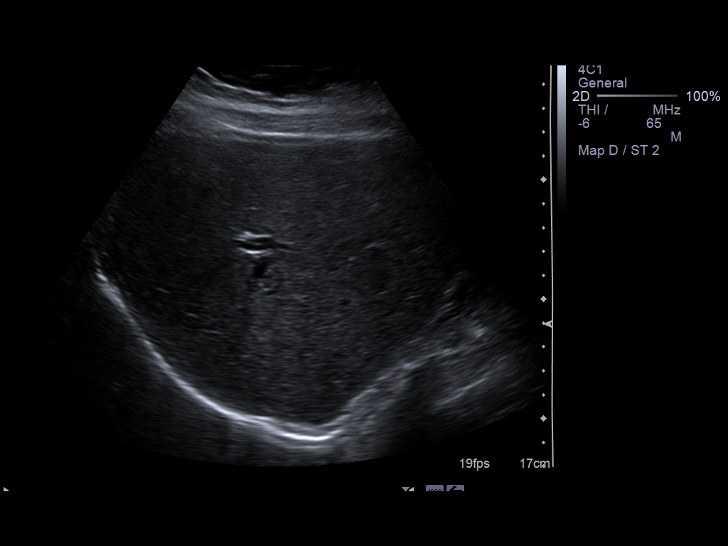
[im 18/71]
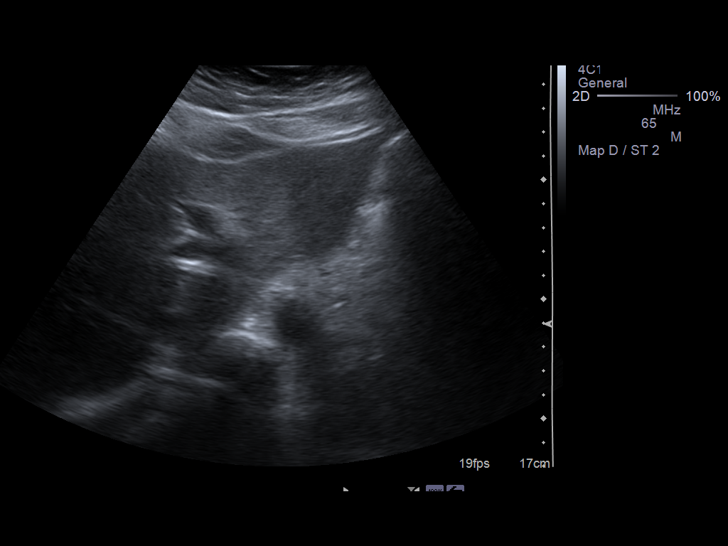
[im 24/71]
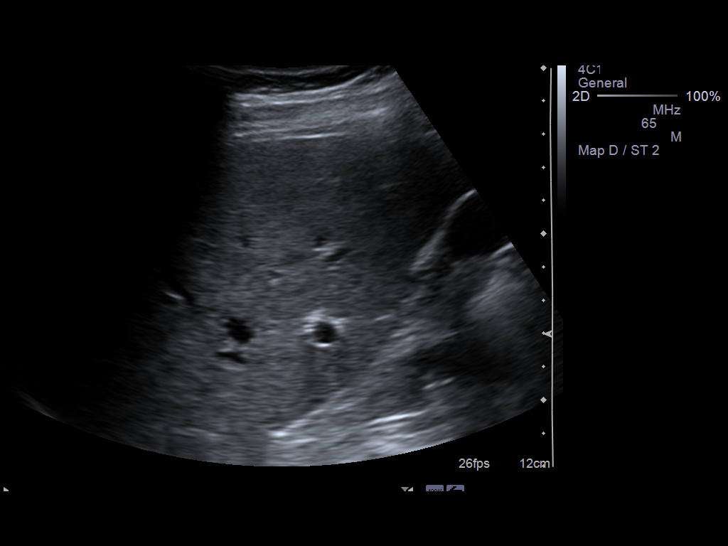
[im 27/71]
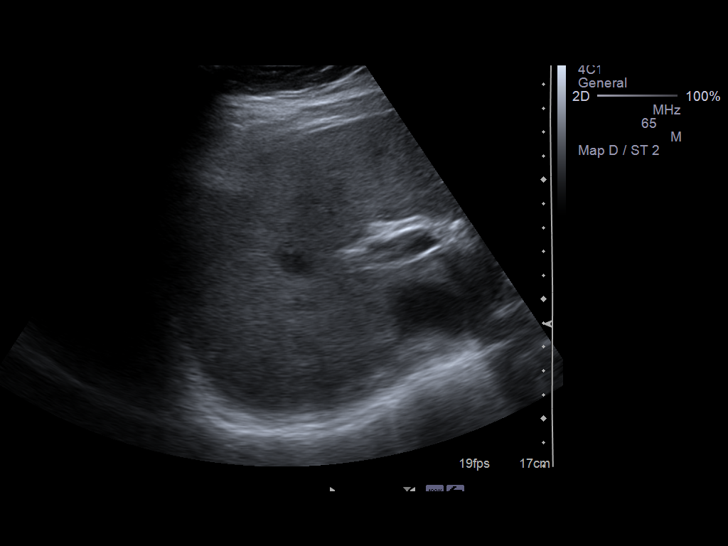
[im 33/71]
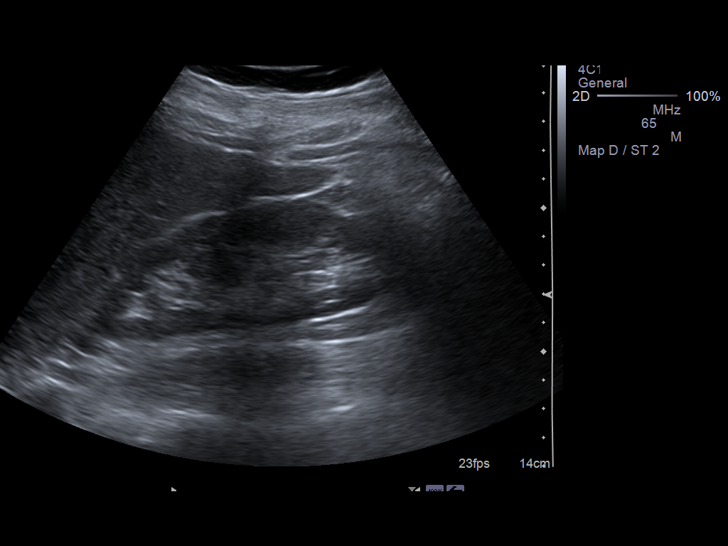
[im 38/71]
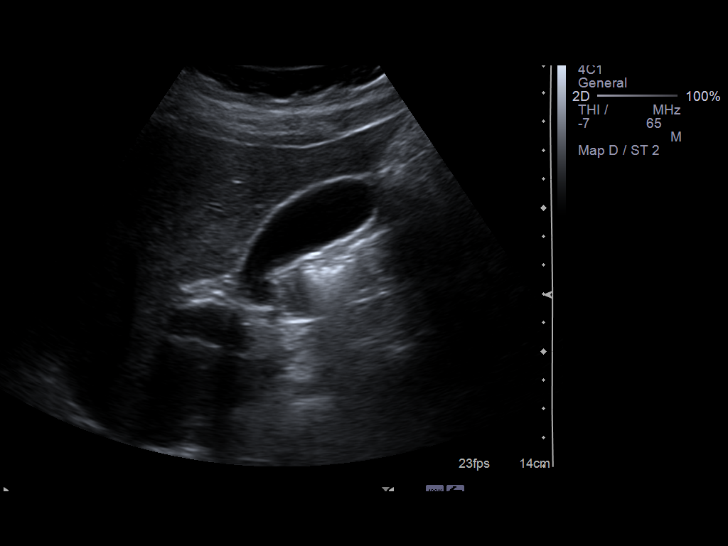
[im 44/71]
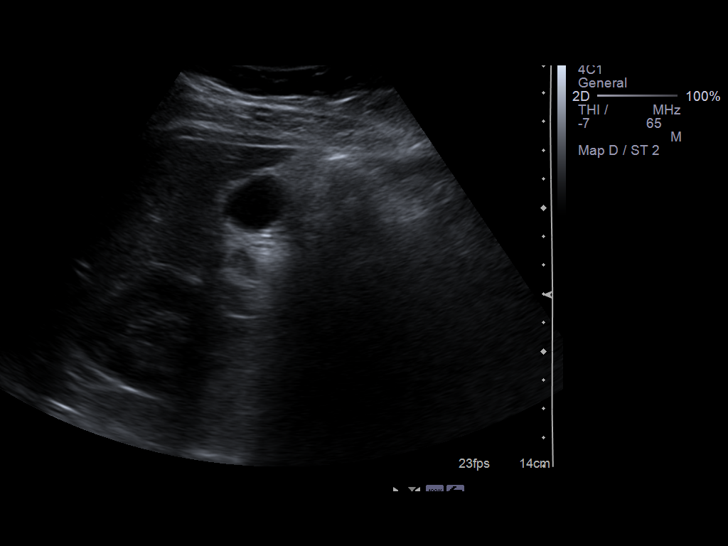
[im 47/71]
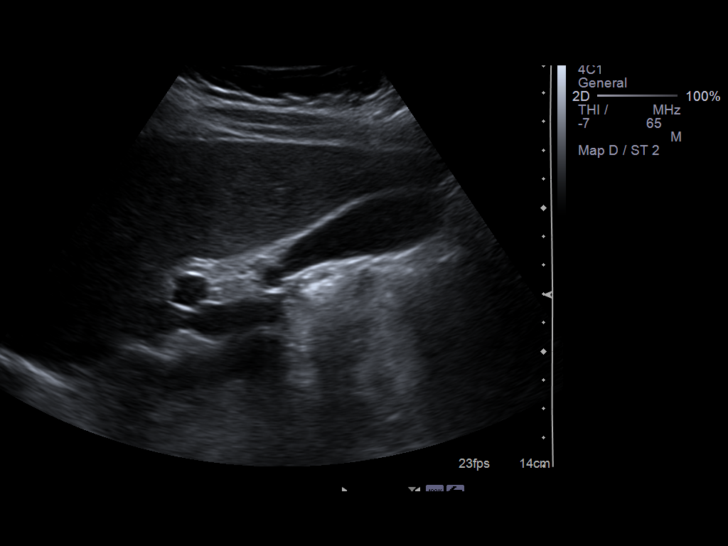
[im 53/71]
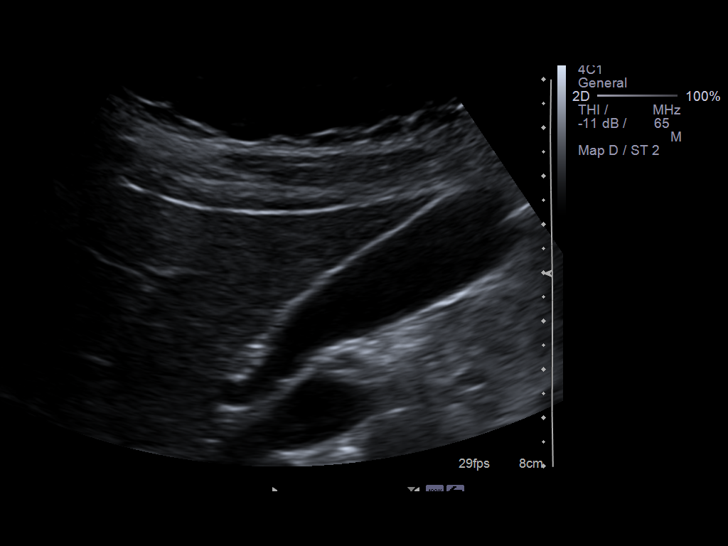
[im 59/71]
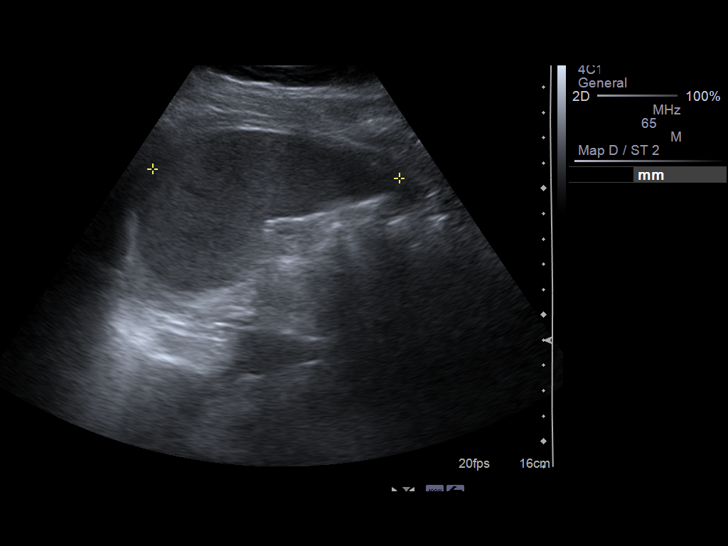
[im 65/71]
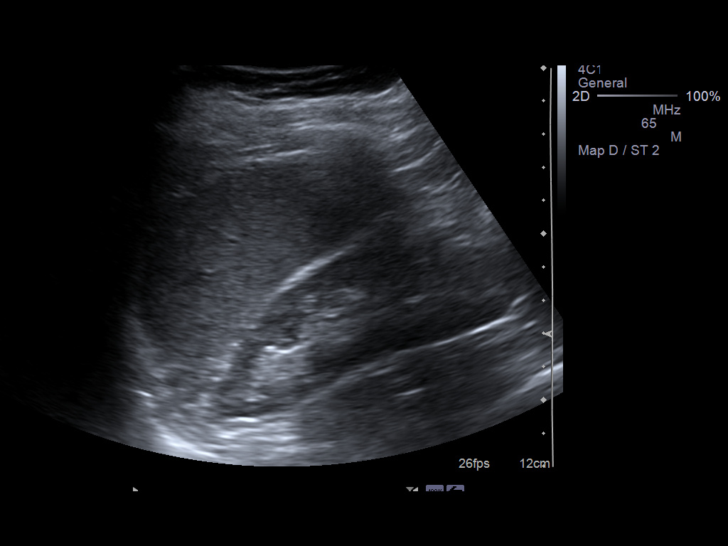
[im 71/71]
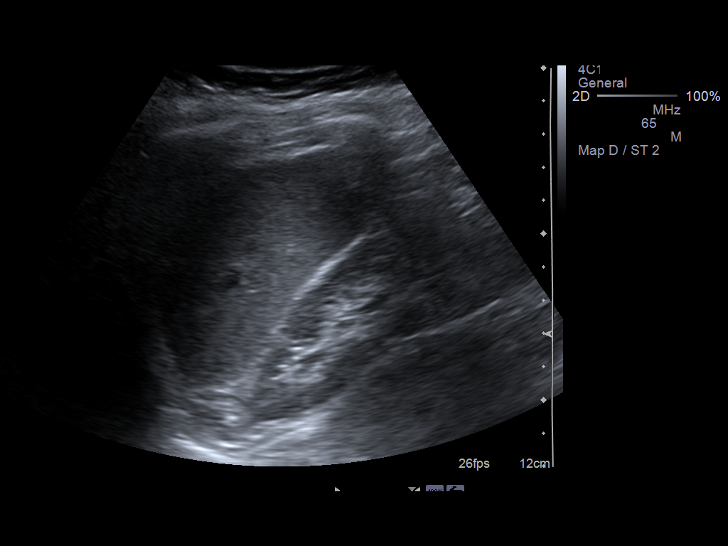

[14 of 25 positions shown; findings below may reference images not displayed]

FINDINGS: Gallbladder:  No stone, gallbladder wall thickening,
pericholecystic fluid, or sonographic Murphy's sign.

Common Bile Duct:  5 mm.  Normal.

Liver: No focal mass lesion identified.  Within normal limits in
parenchymal echogenicity.

IVC:  Appears normal.

Pancreas:  No abnormality identified.

Spleen:  Within normal limits in size and echotexture.

Right kidney:  Normal in size and parenchymal echogenicity.  No
evidence of mass or hydronephrosis.

Left kidney:  Normal in size and parenchymal echogenicity.  No
evidence of mass or hydronephrosis.

Abdominal Aorta:  No aneurysm identified.
IMPRESSION: Negative abdominal ultrasound.

## 2011-01-02 ENCOUNTER — Telehealth: Payer: Self-pay | Admitting: *Deleted

## 2011-01-02 NOTE — Telephone Encounter (Signed)
Pt called and is requesting  A referral to a retinal specialist She saw Dr Ike Bene for routine eye exam about a month ago.  She was told she had retinal hemorrhage.  Pt # [id number]

## 2011-01-05 NOTE — Telephone Encounter (Signed)
As I recall, Dr. Ike Bene was working on the appropriate referral. We should contact their office to inquire about the referral before working on it again ourselves.

## 2011-01-06 ENCOUNTER — Telehealth: Payer: Self-pay | Admitting: *Deleted

## 2011-01-06 NOTE — Telephone Encounter (Signed)
Caroline Hoffman has been working on this referral.  Will send to her.

## 2011-01-06 NOTE — Telephone Encounter (Signed)
Pt calls stating she has not heard anything concerning her eye appt and wants to know what is going on with it, she is also requesting lyrica be ordered, states she had discussed this w/ the dr at a prior appt. i spoke w/ mamie re: eye, mamie will send to [id number] today, all pt's cards are up to date. Pt is reminded to keep her appt in may, she is agreeable. Her ph# [phone].

## 2011-01-06 NOTE — Telephone Encounter (Signed)
Can someone call her opthalmologist and find out what progress they've had with this referral? My memory is that their office was working on referring her to a retinal specialist and they are probably better positioned to know who specifically she needs to see.

## 2011-01-08 NOTE — Telephone Encounter (Signed)
Since pt has only the orange card from [facility] the referral will need to go through [id number]

## 2011-01-23 ENCOUNTER — Encounter: Payer: Self-pay | Admitting: Internal Medicine

## 2011-01-23 ENCOUNTER — Other Ambulatory Visit (HOSPITAL_COMMUNITY): Payer: Self-pay | Admitting: Internal Medicine

## 2011-01-23 ENCOUNTER — Ambulatory Visit (INDEPENDENT_AMBULATORY_CARE_PROVIDER_SITE_OTHER): Payer: Self-pay | Admitting: Internal Medicine

## 2011-01-23 DIAGNOSIS — F329 Major depressive disorder, single episode, unspecified: Secondary | ICD-10-CM

## 2011-01-23 DIAGNOSIS — Z1231 Encounter for screening mammogram for malignant neoplasm of breast: Secondary | ICD-10-CM

## 2011-01-23 DIAGNOSIS — F411 Generalized anxiety disorder: Secondary | ICD-10-CM

## 2011-01-23 DIAGNOSIS — M797 Fibromyalgia: Secondary | ICD-10-CM

## 2011-01-23 DIAGNOSIS — IMO0001 Reserved for inherently not codable concepts without codable children: Secondary | ICD-10-CM

## 2011-01-23 DIAGNOSIS — H3560 Retinal hemorrhage, unspecified eye: Secondary | ICD-10-CM

## 2011-01-23 MED ORDER — CYCLOBENZAPRINE HCL 10 MG PO TABS: 10.0000 mg | ORAL_TABLET | Freq: Three times a day (TID) | ORAL | Status: DC | PRN

## 2011-01-23 MED ORDER — CITALOPRAM HYDROBROMIDE 20 MG PO TABS: 20.0000 mg | ORAL_TABLET | Freq: Every day | ORAL | Status: DC

## 2011-01-23 NOTE — Patient Instructions (Signed)
I have prescribed a new medication (Celexa) which should help with your fibromyalgia and depression. It should be taken every day and usually takes several weeks to take effect.

## 2011-01-26 DIAGNOSIS — F32A Depression, unspecified: Secondary | ICD-10-CM | POA: Insufficient documentation

## 2011-01-26 DIAGNOSIS — F329 Major depressive disorder, single episode, unspecified: Secondary | ICD-10-CM | POA: Insufficient documentation

## 2011-01-26 HISTORY — DX: Depression, unspecified: F32.A

## 2011-01-26 NOTE — Assessment & Plan Note (Signed)
Patient complains that Xanax "knocks her out" and makes her sleep but does not treat the anxiety. She is encouraged to break the 0.5mg  tabs in half and try taking just 0.25mg  in the hopes that it will improve her anxiety without making her so drowsy.

## 2011-01-26 NOTE — Assessment & Plan Note (Addendum)
Will start her on Celexa and have her return to clinic in about 2 weeks for close follow-up. Patient advised to seek immediate medical attention if she develops suicidal thoughts or plans. Patient instructed to taper off and discontinue amitriptyline which she had previously been taking.

## 2011-01-26 NOTE — Progress Notes (Signed)
Subjective:    Patient ID: Caroline Hoffman, female    DOB: 1961/01/13, 50 y.o.   MRN: [id number]  HPI 50yo W presents for follow-up of  1. Fibromyalgia. She tearfully complains that she hurts all over, gets stabbing pains in her back and legs and that these pains prevent her from carrying out daily activities such as reaching down into the refrigerator or working. She also describes significant depressed mood and recurrent thoughts of "why did this [fibromyalgia] have to happen to me." She denies suicidal thoughts or plans but says that she does sometimes think that she "can't go on like this." She has trouble sleeping because of the pain and depressed thoughts. She is frustrated that she cannot work and states that she is "not a lazy person."  2. Retinal hemorrhage. Found several months ago to have a retinal hemorrhage in L eye. Has thus far been unable to get in to see a retinal specialist because of lack of insurance. Denies any recent changes in vision. However, she is extremely anxious and fears that she will lose vision in that eye.  Current Outpatient Prescriptions  Medication Sig Dispense Refill  . ALPRAZolam (XANAX) 0.5 MG tablet Take 0.5 mg by mouth 2 (two) times daily as needed. For anxiety       . Calcium Carbonate-Vitamin D (CALCIUM 600+D) 600-400 MG-UNIT per tablet Take 1 tablet by mouth daily.        Marland Kitchen lisinopril (PRINIVIL,ZESTRIL) 20 MG tablet Take 20 mg by mouth daily.        . citalopram (CELEXA) 20 MG tablet Take 1 tablet (20 mg total) by mouth daily.  30 tablet  2  . cyclobenzaprine (FLEXERIL) 10 MG tablet Take 1 tablet (10 mg total) by mouth every 8 (eight) hours as needed for muscle spasms.  30 tablet  1     Past Medical History  Diagnosis Date  . Hypertension   . Fibromyalgia   . Retinal hemorrhage   . Uterine fibroid      Review of Systems  Constitutional: Negative for fever, chills and fatigue.  HENT: Negative for congestion, sore throat and postnasal drip.     Eyes: Negative for visual disturbance.  Respiratory: Negative for cough, chest tightness, shortness of breath and wheezing.   Cardiovascular: Negative for chest pain and palpitations.  Gastrointestinal: Negative for nausea, vomiting, abdominal pain, diarrhea, constipation and blood in stool.  Genitourinary: Negative for dysuria.  Musculoskeletal: Positive for myalgias, back pain and arthralgias. Negative for joint swelling and gait problem.  Skin: Negative for rash.  Neurological: Negative for dizziness, light-headedness, numbness and headaches.  Psychiatric/Behavioral: Positive for sleep disturbance and dysphoric mood. Negative for suicidal ideas and self-injury. The patient is nervous/anxious.        Objective:   Physical Exam  Nursing note and vitals reviewed. Constitutional: She is oriented to person, place, and time. She appears well-developed and well-nourished. No distress.  HENT:  Head: Normocephalic and atraumatic.  Mouth/Throat: Oropharynx is clear and moist. No oropharyngeal exudate.  Eyes: Conjunctivae and EOM are normal. Pupils are equal, round, and reactive to light. No scleral icterus.  Neck: Normal range of motion. Neck supple. No tracheal deviation present. No thyromegaly present.  Cardiovascular: Normal rate, regular rhythm, normal heart sounds and intact distal pulses.  Exam reveals no gallop and no friction rub.   No murmur heard. Pulmonary/Chest: Effort normal and breath sounds normal. No respiratory distress. She has no wheezes. She has no rales.  Abdominal: Soft. Bowel sounds  are normal. She exhibits no distension and no mass. There is no tenderness. There is no rebound and no guarding.  Musculoskeletal: Normal range of motion. She exhibits no edema and no tenderness.       Reports diffuse tenderness to palpation of entire spine -- no focal tenderness. Straight leg raise negative.   Lymphadenopathy:    She has no cervical adenopathy.  Neurological: She is alert  and oriented to person, place, and time. No cranial nerve deficit.  Skin: Skin is warm and dry. No rash noted. She is not diaphoretic.  Psychiatric:       Is tearful and appears anxious.           Assessment & Plan:

## 2011-01-26 NOTE — Assessment & Plan Note (Signed)
Will prescribe Celexa which will hopefully treat depression and fibromyalgia. Patient advised that this medication will not produce an immediate effect. She may continue to use Flexeril as needed for back spasms. Will see her back in a couple of weeks at which time, may consider increasing dose of Celexa if she is doing well with it.

## 2011-01-26 NOTE — Assessment & Plan Note (Signed)
Will continue to work on referral to retinal specialist for treatment of retinal hemorrhage.

## 2011-01-30 ENCOUNTER — Telehealth: Payer: Self-pay | Admitting: *Deleted

## 2011-01-30 NOTE — Telephone Encounter (Signed)
Pt's lawyer request a letter of support,  Fax # [phone], the person speaking states the letter has been drafted? Please advise

## 2011-02-02 ENCOUNTER — Encounter: Payer: Self-pay | Admitting: Internal Medicine

## 2011-02-02 NOTE — Telephone Encounter (Signed)
I sent a short statement to [facility] who said she would take care of it. I expect that it's been done by now. Thanks

## 2011-02-11 ENCOUNTER — Ambulatory Visit (HOSPITAL_COMMUNITY)
Admission: RE | Admit: 2011-02-11 | Discharge: 2011-02-11 | Disposition: A | Discharge status: Home / Self Care | Payer: Self-pay | Source: Ambulatory Visit | Attending: Internal Medicine | Admitting: Internal Medicine

## 2011-02-11 DIAGNOSIS — Z1231 Encounter for screening mammogram for malignant neoplasm of breast: Secondary | ICD-10-CM

## 2011-02-13 ENCOUNTER — Ambulatory Visit (INDEPENDENT_AMBULATORY_CARE_PROVIDER_SITE_OTHER): Payer: Self-pay | Admitting: Internal Medicine

## 2011-02-13 ENCOUNTER — Encounter: Payer: Self-pay | Admitting: Internal Medicine

## 2011-02-13 VITALS — BP 134/97 | HR 66 | Temp 98.6°F | Ht 68.0 in | Wt 187.1 lb

## 2011-02-13 DIAGNOSIS — I10 Essential (primary) hypertension: Secondary | ICD-10-CM

## 2011-02-13 DIAGNOSIS — R635 Abnormal weight gain: Secondary | ICD-10-CM

## 2011-02-13 DIAGNOSIS — IMO0001 Reserved for inherently not codable concepts without codable children: Secondary | ICD-10-CM

## 2011-02-13 DIAGNOSIS — Z0000 Encounter for general adult medical examination without abnormal findings: Secondary | ICD-10-CM

## 2011-02-13 DIAGNOSIS — F329 Major depressive disorder, single episode, unspecified: Secondary | ICD-10-CM

## 2011-02-13 MED ORDER — ALPRAZOLAM 0.5 MG PO TABS: 0.5000 mg | ORAL_TABLET | Freq: Two times a day (BID) | ORAL | Status: DC | PRN

## 2011-02-13 MED ORDER — CITALOPRAM HYDROBROMIDE 40 MG PO TABS: 40.0000 mg | ORAL_TABLET | Freq: Every day | ORAL | Status: DC

## 2011-02-13 NOTE — Patient Instructions (Signed)
We are increasing your fibromyalgia/depression medicine (Celexa) from 20mg  daily to 40mg  daily. You can take two 20mg  tablets daily until you run out and then you can fill the 40mg  tablets (prescription already sent to your pharmacy).   Please call our office if you develop worsened pain, anxiety, or depression symptoms.

## 2011-02-13 NOTE — Progress Notes (Signed)
Subjective:    Patient ID: Caroline Hoffman, female    DOB: 1961/08/24, 49 y.o.   MRN: [id number]  HPI 50yo W presents for follow-up of  1. fibromyalgia and depression. At last visit, she was started on Celexa 20mg  daily and says that she does feel somewhat improved already. She has been sleeping better and her mood is improved (she is less often tearful). She still takes Xanax 0.5mg  once to twice daily for treatment of anxiety. She still reports feelings of aching all over, particularly in her neck and near her breasts since having a mammogram a few days ago.  2. Routine GYN health. She wants to know if it is time for her next Pap smear. Her last Pap was in 08/2009 and was normal. She reports having an "abnormal" Pap smear when she was 50years old, which she says was treated with some kind of cryotherapy -- she was also told at the time that it had something to do with her OCPs which were stopped. She has had regular Pap smears for the past 35+ years and has never again had an abnormal Pap.  3. Overweight. She is trying to lose weight and frustrated that she has not made much progress. She has been trying to eat a healthy diet but her fibromyalgia pain limits her ability to exercise.   Current Outpatient Prescriptions on File Prior to Visit  Medication Sig Dispense Refill  . ALPRAZolam (XANAX) 0.5 MG tablet Take 1 tablet (0.5 mg total) by mouth 2 (two) times daily as needed. For anxiety  30 tablet  2  . Calcium Carbonate-Vitamin D (CALCIUM 600+D) 600-400 MG-UNIT per tablet Take 1 tablet by mouth daily.        . citalopram (CELEXA) 40 MG tablet Take 1 tablet (40 mg total) by mouth daily.  30 tablet  2  . cyclobenzaprine (FLEXERIL) 10 MG tablet Take 1 tablet (10 mg total) by mouth every 8 (eight) hours as needed for muscle spasms.  30 tablet  1  . lisinopril (PRINIVIL,ZESTRIL) 20 MG tablet Take 20 mg by mouth daily.          Past Medical History  Diagnosis Date  . Hypertension   . Fibromyalgia    . Retinal hemorrhage   . Uterine fibroid      Review of Systems  Constitutional: Negative for fever, chills and fatigue.  HENT: Negative for congestion and sore throat.   Eyes: Negative for visual disturbance.  Respiratory: Negative for cough, chest tightness and shortness of breath.   Cardiovascular: Negative for chest pain and palpitations.  Gastrointestinal: Negative for nausea, vomiting, abdominal pain, diarrhea and constipation.  Genitourinary: Negative for dysuria and difficulty urinating.  Musculoskeletal: Positive for myalgias and arthralgias.  Skin: Negative for rash.  Neurological: Negative for syncope, weakness and numbness.  Psychiatric/Behavioral: Positive for dysphoric mood. Negative for suicidal ideas, hallucinations, sleep disturbance and self-injury. The patient is nervous/anxious.   All other systems reviewed and are negative.       Objective:   Physical Exam  Nursing note and vitals reviewed. Constitutional: She is oriented to person, place, and time. She appears well-developed and well-nourished. No distress.  HENT:  Head: Normocephalic and atraumatic.  Mouth/Throat: Oropharynx is clear and moist. No oropharyngeal exudate.  Eyes: Conjunctivae and EOM are normal. Pupils are equal, round, and reactive to light. No scleral icterus.  Neck: Normal range of motion. Neck supple. No tracheal deviation present. No thyromegaly present.  Cardiovascular: Normal rate, regular rhythm, normal heart  sounds and intact distal pulses.  Exam reveals no gallop and no friction rub.   No murmur heard. Pulmonary/Chest: Effort normal and breath sounds normal. No respiratory distress. She has no wheezes. She has no rales.  Abdominal: Soft. Bowel sounds are normal. She exhibits no distension. There is no tenderness. There is no rebound and no guarding.  Musculoskeletal: Normal range of motion. She exhibits no edema and no tenderness.  Lymphadenopathy:    She has no cervical  adenopathy.  Neurological: She is alert and oriented to person, place, and time. No cranial nerve deficit.  Skin: Skin is warm and dry. No rash noted. She is not diaphoretic.  Psychiatric: She has a normal mood and affect. Her behavior is normal. Judgment and thought content normal.          Assessment & Plan:

## 2011-02-16 DIAGNOSIS — Z0000 Encounter for general adult medical examination without abnormal findings: Secondary | ICD-10-CM | POA: Insufficient documentation

## 2011-02-16 NOTE — Assessment & Plan Note (Signed)
Her mood is much better at this visit and she is not currently tearful. She states that she has been feeling better and sleeping better since starting Celexa but is "not quite there." As she seems to be tolerating it well, will increase Celexa to 40mg  daily. She denies any suicidal thoughts at this visit and, as before, I advised her to seek immediate medical attention if she were to develop such thoughts.

## 2011-02-16 NOTE — Assessment & Plan Note (Signed)
Patient still reports diffuse aches and pains that are severe enough to limit activity, although this seems to be somewhat better. I am hopeful that this may continue to improve with Celexa, which we will increase to 40mg  daily. She continues to use Flexeril prn, one to three times daily. She was again cautioned about sedation and not to take more that prescribed or to drive while taking the medication. She asked for Percocet, which she says has relieved her pain before, but I explained that there was currently no indication for such a strong opiate. She says she is unable to take Tramadol or Vicodin. I encouraged her to try over-the-counter NSAIDs as she denies any contraindications. She understands and agrees.

## 2011-02-16 NOTE — Assessment & Plan Note (Signed)
Although patient reports a possible "abnormal" Pap when she was 14, she has had regular Paps for that past 35 years which have all been. As she is not considered to be at increased risk, I discussed with the patient that I felt it is safe and in keeping with current guidelines to move to doing her Paps only once every 3 years. She agrees and is pleased with that plan. Her next Pap smear will be due in 08/2012.

## 2011-02-16 NOTE — Assessment & Plan Note (Signed)
Patient was counseled on healthy diet choices and counting calories.

## 2011-02-16 NOTE — Assessment & Plan Note (Signed)
Controlled  Continue current management

## 2011-03-06 ENCOUNTER — Ambulatory Visit (INDEPENDENT_AMBULATORY_CARE_PROVIDER_SITE_OTHER): Payer: Self-pay | Admitting: Internal Medicine

## 2011-03-06 ENCOUNTER — Encounter: Payer: Self-pay | Admitting: Internal Medicine

## 2011-03-06 DIAGNOSIS — F329 Major depressive disorder, single episode, unspecified: Secondary | ICD-10-CM

## 2011-03-06 DIAGNOSIS — IMO0001 Reserved for inherently not codable concepts without codable children: Secondary | ICD-10-CM

## 2011-03-06 DIAGNOSIS — I10 Essential (primary) hypertension: Secondary | ICD-10-CM

## 2011-03-06 DIAGNOSIS — F411 Generalized anxiety disorder: Secondary | ICD-10-CM

## 2011-03-06 NOTE — Assessment & Plan Note (Signed)
Continues to have ongoing depressed mood and difficulty sleeping, although this seems to be better since starting celexa. Denies SI. Continue Celexa 40mg  daily.

## 2011-03-06 NOTE — Patient Instructions (Signed)
Try taking an anti-inflammatory medication (Aleve or ibuprofen) regularly to help with your muscle aches and pains.   Exercise your body and try gentle stretching daily.  Continue taking your medications daily and call us with any problems.

## 2011-03-06 NOTE — Progress Notes (Signed)
Subjective:    Patient ID: Caroline Hoffman, female    DOB: October 04, 1960, 50 y.o.   MRN: [id number]  HPI Caroline Hoffman is a 50yo W who presents for follow-up of: 1. Fibromyalgia. Continues to have diffuse myalgia. She is particularly troubled by tightness and "spasms" in her shoulders. This pain limits her ability to complete routine tasks of living, like reaching in the refrigerator or cleaning the bathtub, which is very distressing and frustrating to her. Recently increased Celexa to 40mg  three weeks ago; she is unsure if this has helped yet. She takes Flexeril, which helps somewhat with these spasms but not completely. She also occasionally takes Aleve or ibuprofen which helps somewhat. The pain often makes it difficult for her to sleep at night.  2. Depression. She continues to report depressed mood and frustration about her body "failing" her (fibromyalgia). Increased Celexa 3 weeks ago, which she is tolerating well. She denies any suicidal ideation.  3. Anxiety. Currently taking Xanax 0.5mg  once daily and will taken a second tablet later in the day if she feels like she's having a "panic attack." Still have anxiety about not being able to work but this is somewhat reduced since she recently applied for disability.   Current Outpatient Prescriptions on File Prior to Visit  Medication Sig Dispense Refill  . ALPRAZolam (XANAX) 0.5 MG tablet Take 1 tablet (0.5 mg total) by mouth 2 (two) times daily as needed. For anxiety  30 tablet  2  . Calcium Carbonate-Vitamin D (CALCIUM 600+D) 600-400 MG-UNIT per tablet Take 1 tablet by mouth daily.        . citalopram (CELEXA) 40 MG tablet Take 1 tablet (40 mg total) by mouth daily.  30 tablet  2  . cyclobenzaprine (FLEXERIL) 10 MG tablet Take 1 tablet (10 mg total) by mouth every 8 (eight) hours as needed for muscle spasms.  30 tablet  1  . lisinopril (PRINIVIL,ZESTRIL) 20 MG tablet Take 40 mg by mouth daily.        Past Medical History  Diagnosis Date  .  Hypertension   . Fibromyalgia   . Retinal hemorrhage   . Uterine fibroid      Review of Systems  Constitutional: Negative for fever, chills and diaphoresis.  HENT: Negative for ear pain and congestion.   Eyes: Negative for visual disturbance.  Respiratory: Negative for cough and shortness of breath.   Cardiovascular: Negative for chest pain, palpitations and leg swelling.  Gastrointestinal: Negative for nausea, vomiting, abdominal pain, diarrhea, constipation and blood in stool.  Genitourinary: Negative for dysuria and difficulty urinating.  Musculoskeletal: Positive for myalgias, back pain and arthralgias. Negative for joint swelling and gait problem.  Skin: Negative for rash.  Neurological: Negative for dizziness, weakness, numbness and headaches.  Psychiatric/Behavioral: Positive for sleep disturbance and dysphoric mood. Negative for suicidal ideas and self-injury. The patient is nervous/anxious.   All other systems reviewed and are negative.       Objective:   Physical Exam  Vitals reviewed. Constitutional: She is oriented to person, place, and time. She appears well-developed and well-nourished. No distress.  HENT:  Head: Normocephalic and atraumatic.  Mouth/Throat: Oropharynx is clear and moist. No oropharyngeal exudate.  Eyes: Conjunctivae and EOM are normal. Pupils are equal, round, and reactive to light. No scleral icterus.  Neck: Normal range of motion. Neck supple. No thyromegaly present.  Cardiovascular: Normal rate and regular rhythm.  Exam reveals no gallop and no friction rub.   No murmur heard. Pulmonary/Chest: Effort  normal and breath sounds normal. No respiratory distress. She has no wheezes. She has no rales. She exhibits no tenderness.  Abdominal: Soft. Bowel sounds are normal. She exhibits no distension. There is no tenderness.  Musculoskeletal: Normal range of motion. She exhibits no edema.       Trapezius muscles seem somewhat tight bilaterally. Patient  reports diffuse tenderness to palpation of entire spine and paraspinal muscles but there is no focal tenderness.   Lymphadenopathy:    She has no cervical adenopathy.  Neurological: She is alert and oriented to person, place, and time. No cranial nerve deficit.       Strength/sensation intact  Skin: Skin is warm and dry. No rash noted. She is not diaphoretic.  Psychiatric:       Appears anxious, extremely worried about health/pain.           Assessment & Plan:

## 2011-03-06 NOTE — Assessment & Plan Note (Signed)
BP slightly elevated today. Will continue to monitor.

## 2011-03-06 NOTE — Assessment & Plan Note (Signed)
Patient's ongoing pain, depression, and anxiety continue to be her major medical problems, which currently hinder her ability to function normally in her daily life. I encouraged her to continue with increased dose of Celexa, as this may have some benefit for her fibromyalgia. I also continued her to try taking NSAIDS more consistently for management of muscle aches and pains. I also instructed her in gentle stretching and encouraged her to continue with physical activity. I offered to refer her to physical therapy but she is reluctant to do this at this point because of fears that it will worsen her pain.

## 2011-03-06 NOTE — Assessment & Plan Note (Signed)
Continue Xanax as needed 

## 2011-03-13 ENCOUNTER — Encounter: Payer: Self-pay | Admitting: Internal Medicine

## 2011-04-22 ENCOUNTER — Encounter: Payer: Self-pay | Admitting: Internal Medicine

## 2011-05-06 ENCOUNTER — Encounter: Payer: Self-pay | Admitting: Internal Medicine

## 2011-06-23 LAB — URINALYSIS, ROUTINE W REFLEX MICROSCOPIC
Nitrite: NEGATIVE
Specific Gravity, Urine: 1.012
Urobilinogen, UA: 0.2
pH: 7.5

## 2011-06-23 LAB — BASIC METABOLIC PANEL
Chloride: 103
GFR calc non Af Amer: >60
Potassium: 3.5
Sodium: 141

## 2011-06-23 LAB — PREGNANCY, URINE: Preg Test, Ur: NEGATIVE

## 2011-09-04 ENCOUNTER — Ambulatory Visit (INDEPENDENT_AMBULATORY_CARE_PROVIDER_SITE_OTHER): Payer: Medicare (Managed Care) | Admitting: Internal Medicine

## 2011-09-04 ENCOUNTER — Encounter: Payer: Self-pay | Admitting: Internal Medicine

## 2011-09-04 VITALS — BP 120/80 | HR 80 | Temp 97.8°F | Ht 68.0 in | Wt 183.6 lb

## 2011-09-04 DIAGNOSIS — Z23 Encounter for immunization: Secondary | ICD-10-CM

## 2011-09-04 DIAGNOSIS — F329 Major depressive disorder, single episode, unspecified: Secondary | ICD-10-CM

## 2011-09-04 DIAGNOSIS — M797 Fibromyalgia: Secondary | ICD-10-CM

## 2011-09-04 DIAGNOSIS — F32A Depression, unspecified: Secondary | ICD-10-CM

## 2011-09-04 DIAGNOSIS — F411 Generalized anxiety disorder: Secondary | ICD-10-CM

## 2011-09-04 DIAGNOSIS — I10 Essential (primary) hypertension: Secondary | ICD-10-CM

## 2011-09-04 DIAGNOSIS — IMO0001 Reserved for inherently not codable concepts without codable children: Secondary | ICD-10-CM

## 2011-09-04 MED ORDER — LOSARTAN POTASSIUM 25 MG PO TABS: 25.0000 mg | ORAL_TABLET | Freq: Every day | ORAL | Status: DC

## 2011-09-04 MED ORDER — ALPRAZOLAM 0.5 MG PO TABS: 0.5000 mg | ORAL_TABLET | Freq: Two times a day (BID) | ORAL | Status: DC | PRN

## 2011-09-04 MED ORDER — CYCLOBENZAPRINE HCL 10 MG PO TABS: 10.0000 mg | ORAL_TABLET | Freq: Three times a day (TID) | ORAL | Status: DC | PRN

## 2011-09-04 MED ORDER — DULOXETINE HCL 30 MG PO CPEP: 30.0000 mg | ORAL_CAPSULE | Freq: Two times a day (BID) | ORAL | Status: DC

## 2011-09-04 NOTE — Progress Notes (Signed)
Subjective:     Patient ID: Caroline Hoffman, female   DOB: [birth year], 50 y.o.   MRN: [id number]  HPI The patient is a 50 year old female who comes in today with only medical history of hypertension and fibromyalgia with anxiety. She has been taking Xanax approximately twice daily as well as Celexa 40 mg daily. She is also taking lisinopril 20 mg daily. She states that she still has significant pain in her everyday life and with daily routine activities. She is also taking Flexeril once a day occasionally twice a day. She states that she is also having a cough which he thinks is coming from her lisinopril. She is up-to-date on most of her health maintenance. She would like to get a flu shot today. She has lost 6 pounds since last time we saw her in June. She is not a smoker. She is not having any shortness of breath, chest pain, change in bowel habits.  Review of Systems  Constitutional: Positive for activity change. Negative for fever, chills, diaphoresis, appetite change, fatigue and unexpected weight change.  HENT: Positive for neck pain and neck stiffness.   Respiratory: Negative for cough, choking, chest tightness and shortness of breath.   Cardiovascular: Negative for chest pain, palpitations and leg swelling.  Gastrointestinal: Negative for nausea, vomiting, abdominal pain, diarrhea and constipation.  Musculoskeletal: Positive for myalgias, back pain and arthralgias. Negative for joint swelling and gait problem.  Skin: Negative for rash and wound.  Neurological: Positive for headaches. Negative for dizziness, tremors, seizures, syncope, facial asymmetry, speech difficulty, weakness, light-headedness and numbness.  Psychiatric/Behavioral: Positive for sleep disturbance and dysphoric mood. Negative for suicidal ideas, hallucinations, behavioral problems, confusion, self-injury, decreased concentration and agitation. The patient is nervous/anxious and is hyperactive.     Vitals: Blood  pressure: 144/90 Pulse: 80 Temp: 97.8 Fahrenheit Height: 5 feet 8 inches Weight: 183 pounds Oxygen saturation on room air: 100%    Objective:   Physical Exam  Constitutional: She is oriented to person, place, and time. She appears well-developed and well-nourished.  HENT:  Head: Normocephalic and atraumatic.  Eyes: EOM are normal. Pupils are equal, round, and reactive to light.  Cardiovascular: Normal rate and regular rhythm.   Pulmonary/Chest: Effort normal and breath sounds normal.  Abdominal: Bowel sounds are normal. She exhibits no distension. There is no tenderness.  Musculoskeletal: Normal range of motion.  Neurological: She is alert and oriented to person, place, and time. No cranial nerve deficit.  Skin: Skin is warm and dry. No rash noted. No erythema. No pallor.       Assessment/Plan:    1. Please see problem-oriented charting.  2. Disposition-patient will be seen back in one month to check on medication changes. We did change her lisinopril to losartan today. We also changed her Flexeril to twice daily. We also changed her Celexa to Cymbalta. We have sent a message to [facility] for any available fibromyalgia resources from the community. I did inform patient that sometimes medication changes can take up to 6-8 weeks to feel full affect. Pt was given flu shot today.

## 2011-09-04 NOTE — Patient Instructions (Addendum)
You were seen for a follow up visit today and to meet your new doctor, Dr. Dorise Hiss. You are doing well with your blood pressure. Continue to work on exercise and stretching. Congratulations on your weight loss of 6 pounds! KEEP up the good work! We are changing 2 of your medicines. STOP taking lisinopril and STOP taking celexa (citalopram). Start taking losartan once daily. Start taking cymbalta (duloxetine) once daily for 1 week then twice daily until you return. We will see you back in 1 month to see how you're doing. Please call our office if you have any questions or problems or need refills. Our number is [phone]. You received a flu shot today and may have some flu-like symptoms over the next couple of days.

## 2011-09-04 NOTE — Progress Notes (Signed)
I have reviewed and discussed the care of this patient in detail with the resident physician including pertinent patient records, physical exam findings and data. I agree with details of this encounter.   

## 2011-09-04 NOTE — Assessment & Plan Note (Signed)
Pt not helped by celexa and change to cymbalta. States she has days where she just cries. I spoke with her about possible counseling and she declined that at this time.

## 2011-09-04 NOTE — Assessment & Plan Note (Signed)
Pt is still having significant symptoms. Will change her celexa to cymbalta today and increase flexeril to BID from QD. Will see her back in 1 month for close follow up. Pt is unable to perform routine ADL's without pain and goal of treatment is to be able to hold her grandchildren without pain.

## 2011-09-04 NOTE — Assessment & Plan Note (Signed)
Pt's BP is good today however she is having cough so changed to losartan from lisinopril. Will recheck BP at next visit in 1 month.

## 2011-09-04 NOTE — Assessment & Plan Note (Signed)
Pt takes xanax once daily and occasionally twice daily. Will refill today.

## 2012-02-05 ENCOUNTER — Ambulatory Visit (INDEPENDENT_AMBULATORY_CARE_PROVIDER_SITE_OTHER): Payer: Medicare Other | Admitting: Internal Medicine

## 2012-02-05 ENCOUNTER — Encounter: Payer: Self-pay | Admitting: Internal Medicine

## 2012-02-05 VITALS — BP 145/96 | HR 70 | Temp 98.5°F | Wt 191.5 lb

## 2012-02-05 DIAGNOSIS — M797 Fibromyalgia: Secondary | ICD-10-CM

## 2012-02-05 DIAGNOSIS — M25539 Pain in unspecified wrist: Secondary | ICD-10-CM

## 2012-02-05 DIAGNOSIS — IMO0001 Reserved for inherently not codable concepts without codable children: Secondary | ICD-10-CM

## 2012-02-05 MED ORDER — GABAPENTIN 300 MG PO CAPS: 300.0000 mg | ORAL_CAPSULE | Freq: Three times a day (TID) | ORAL | Status: DC

## 2012-02-05 NOTE — Progress Notes (Signed)
Subjective:     Patient ID: Caroline Hoffman, female   DOB: 08-15-1961, 51 y.o.   MRN: [id number]  HPI The patient is a 51 year old female with past medical history of fibromyalgia, hypertension. She presents for today's acute visit with having some wrist and shoulder pain the pain does start in her neck area and radiate down her shoulders to her hands. She states she was out walking her dog and he pulled the leash a little bit hard and she got jerked. She is having no wrist pain however is able to fully move her or her fingers she doesn't have point tenderness over any of the bones or joints or the anatomical snuffbox. She hasn't had any fevers chills shortness of breath chest pain at home. She is still taking her Cymbalta and using Xanax for anxiety. She seems to be very talkative then talking very quickly at today's visit. She states that she does not wish to seek counseling at this time. She is currently taking Cymbalta, Flexeril for her pain and Cozaar for her high blood pressure.  Review of Systems  Constitutional: Positive for activity change. Negative for fever, chills, diaphoresis, appetite change, fatigue and unexpected weight change.  HENT: Negative.  Negative for neck pain and neck stiffness.   Respiratory: Negative.   Cardiovascular: Negative.   Gastrointestinal: Negative.   Musculoskeletal: Positive for myalgias and arthralgias. Negative for back pain, joint swelling and gait problem.  Skin: Negative.   Neurological: Negative.   Psychiatric/Behavioral: Negative.     Vitals: Blood pressure: 145/96 Pulse: 70 Temperature: 98.63F Weight: 191 pounds    Objective:   Physical Exam  Constitutional: She is oriented to person, place, and time. She appears well-developed and well-nourished.  HENT:  Head: Normocephalic and atraumatic.  Eyes: EOM are normal.  Neck: Normal range of motion. Neck supple.  Cardiovascular: Normal rate and regular rhythm.   Pulmonary/Chest: Effort normal  and breath sounds normal.  Abdominal: Soft. Bowel sounds are normal.  Musculoskeletal: Normal range of motion. She exhibits no edema and no tenderness.       No weakness in the hand or wrist, able to fully adduct and abduct.   Neurological: She is alert and oriented to person, place, and time.  Skin: Skin is warm and dry.  Psychiatric:       Appears to be talking very quickly with lots of interruptive quality to her speech.        Assessment/Plan:   1. Wrist pain-patient is likely experiencing nerve pain, she does have some spurs in her neck region that may be causing nerve pain. She also has some bulging discs which were nonsurgical at last visit with her back doctor. Have advised her that taking Neurontin may help with this nerve pain. Advised her on its usage. Advised her to take one pill daily for 3 days then 1 pill twice daily for 3 days then up to 3 pills a day. Do not think that she needs x-ray at this time. No tenderness over any of the bones, anatomical snuff box. Full range of motion, no weakness in hand grip or any musculature. Abduction and adduction intact.  2. Fibromyalgia-not controlled will address at next visit.  3. Disposition-patient was given wrist splint and gabapentin prescriptions. We'll see her back in 7-12 days for a checkup on this pain and make sure that it is resolving. At that time if there is sufficient time may discuss her fibromyalgia and have her talk to our Child psychotherapist.

## 2012-02-05 NOTE — Patient Instructions (Signed)
You were seen today for some wrist and shoulder pain. This is likely nerve pain from your back and your fibromyalgia. We are going to give you a wrist splint and some medicine called gabapentin for nerve pain. Take 1 pill daily for 3 days then take 1 pill in the morning and 1 at night for 3 days then take 1 pill 3 times daily. We will see you back in 7-12 days to check on your pain.   Fibromyalgia Fibromyalgia is a disorder that is often misunderstood. It is associated with muscular pains and tenderness that comes and goes. It is often associated with fatigue and sleep disturbances. Though it tends to be long-lasting, fibromyalgia is not life-threatening. CAUSES  The exact cause of fibromyalgia is unknown. People with certain gene types are predisposed to developing fibromyalgia and other conditions. Certain factors can play a role as triggers, such as:  Spine disorders.   Arthritis.   Severe injury (trauma) and other physical stressors.   Emotional stressors.  SYMPTOMS   The main symptom is pain and stiffness in the muscles and joints, which can vary over time.   Sleep and fatigue problems.  Other related symptoms may include:  Bowel and bladder problems.   Headaches.   Visual problems.   Problems with odors and noises.   Depression or mood changes.   Painful periods (dysmenorrhea).   Dryness of the skin or eyes.  DIAGNOSIS  There are no specific tests for diagnosing fibromyalgia. Patients can be diagnosed accurately from the specific symptoms they have. The diagnosis is made by determining that nothing else is causing the problems. TREATMENT  There is no cure. Management includes medicines and an active, healthy lifestyle. The goal is to enhance physical fitness, decrease pain, and improve sleep. HOME CARE INSTRUCTIONS   Only take over-the-counter or prescription medicines as directed by your caregiver. Sleeping pills, tranquilizers, and pain medicines may make your problems  worse.   Low-impact aerobic exercise is very important and advised for treatment. At first, it may seem to make pain worse. Gradually increasing your tolerance will overcome this feeling.   Learning relaxation techniques and how to control stress will help you. Biofeedback, visual imagery, hypnosis, muscle relaxation, yoga, and meditation are all options.   Anti-inflammatory medicines and physical therapy may provide short-term help.   Acupuncture or massage treatments may help.   Take muscle relaxant medicines as suggested by your caregiver.   Avoid stressful situations.   Plan a healthy lifestyle. This includes your diet, sleep, rest, exercise, and friends.   Find and practice a hobby you enjoy.   Join a fibromyalgia support group for interaction, ideas, and sharing advice. This may be helpful.  SEEK MEDICAL CARE IF:  You are not having good results or improvement from your treatment. FOR MORE INFORMATION  National Fibromyalgia Association: www.fmaware.org Arthritis Foundation: www.arthritis.org Document Released: 09/07/2005 Document Revised: 08/27/2011 Document Reviewed: 12/18/2009 [facility] Patient Information 2012 [place], [place].

## 2012-02-08 ENCOUNTER — Encounter: Payer: Medicare (Managed Care) | Admitting: Internal Medicine

## 2012-02-12 ENCOUNTER — Encounter: Payer: Medicare (Managed Care) | Admitting: Internal Medicine

## 2012-05-18 ENCOUNTER — Other Ambulatory Visit: Payer: Self-pay | Admitting: *Deleted

## 2012-05-18 ENCOUNTER — Ambulatory Visit: Payer: Medicare Other | Admitting: Internal Medicine

## 2012-05-18 DIAGNOSIS — M797 Fibromyalgia: Secondary | ICD-10-CM

## 2012-05-18 NOTE — Telephone Encounter (Signed)
Why can't she get her medications refilled tomorrow then?

## 2012-05-18 NOTE — Telephone Encounter (Signed)
Pt states she could not find a parking place today so missed her appointment.  She did reschedule for tomorrow

## 2012-05-19 ENCOUNTER — Ambulatory Visit: Payer: Medicare Other | Admitting: Internal Medicine

## 2012-07-15 ENCOUNTER — Encounter: Payer: Medicare Other | Admitting: Internal Medicine

## 2012-07-27 ENCOUNTER — Telehealth: Payer: Self-pay | Admitting: *Deleted

## 2012-07-27 NOTE — Telephone Encounter (Signed)
Pt upset she has no refills. Dr Dorise Hiss denied meds 04/2012 - needs appt. Pt refuses to see another doctor - has appt 08/2012. Stanton Kidney Daryan Buell RN 07/27/12 4:10PM

## 2012-08-26 ENCOUNTER — Encounter: Payer: Medicare Other | Admitting: Internal Medicine

## 2012-08-26 ENCOUNTER — Ambulatory Visit (INDEPENDENT_AMBULATORY_CARE_PROVIDER_SITE_OTHER): Payer: Medicare Other | Admitting: Internal Medicine

## 2012-08-26 ENCOUNTER — Encounter: Payer: Self-pay | Admitting: Internal Medicine

## 2012-08-26 VITALS — BP 120/70 | HR 70 | Temp 97.6°F | Wt 192.6 lb

## 2012-08-26 DIAGNOSIS — M797 Fibromyalgia: Secondary | ICD-10-CM

## 2012-08-26 DIAGNOSIS — I10 Essential (primary) hypertension: Secondary | ICD-10-CM

## 2012-08-26 DIAGNOSIS — IMO0001 Reserved for inherently not codable concepts without codable children: Secondary | ICD-10-CM

## 2012-08-26 DIAGNOSIS — Z23 Encounter for immunization: Secondary | ICD-10-CM

## 2012-08-26 MED ORDER — CYCLOBENZAPRINE HCL 10 MG PO TABS: 10.0000 mg | ORAL_TABLET | Freq: Three times a day (TID) | ORAL | Status: DC | PRN

## 2012-08-26 MED ORDER — DULOXETINE HCL 30 MG PO CPEP: 30.0000 mg | ORAL_CAPSULE | Freq: Two times a day (BID) | ORAL | Status: DC

## 2012-08-26 MED ORDER — LOSARTAN POTASSIUM 25 MG PO TABS: 25.0000 mg | ORAL_TABLET | Freq: Every day | ORAL | Status: DC

## 2012-08-26 MED ORDER — GABAPENTIN 300 MG PO CAPS: 300.0000 mg | ORAL_CAPSULE | Freq: Three times a day (TID) | ORAL | Status: DC

## 2012-08-26 MED ORDER — CALCIUM CARBONATE-VITAMIN D 600-400 MG-UNIT PO TABS: 1.0000 | ORAL_TABLET | Freq: Every day | ORAL | Status: DC

## 2012-08-26 MED ORDER — ALPRAZOLAM 0.5 MG PO TABS: 0.5000 mg | ORAL_TABLET | Freq: Two times a day (BID) | ORAL | Status: DC | PRN

## 2012-08-26 NOTE — Patient Instructions (Signed)
General Instructions:  Continue taking your medicines as prescribed, you are doing well! Think about joining the [facility] and doing some water aerobics if you can. We will see you back in 6 months. If you are sick sooner or need to be seen please call us at [phone].   Treatment Goals:  Goals (1 Years of Data) as of 08/26/2012    None      Progress Toward Treatment Goals:  Treatment Goal 08/26/2012  Blood pressure at goal    Self Care Goals & Plans:  Self Care Goal 08/26/2012  Monitor my health keep track of my weight       Care Management & Community Referrals:  Referral 08/26/2012  Referrals made for care management support none needed

## 2012-08-29 ENCOUNTER — Telehealth: Payer: Self-pay | Admitting: *Deleted

## 2012-08-29 NOTE — Assessment & Plan Note (Signed)
BP Readings from Last 3 Encounters:  08/26/12 120/70  02/05/12 145/96  09/04/11 120/80    Lab Results  Component Value Date   NA 142 06/10/2010   K 4.0 06/10/2010   CREATININE 1.03 06/10/2010    Assessment:  Blood pressure control: controlled  Progress toward BP goal:  at goal  Comments: good compliance with medications  Plan:  Medications:  continue current medications cozaar 25 mg PO daily  Educational resources provided: brochure  Self management tools provided: instructions for home blood pressure monitoring  Other plans: None

## 2012-08-29 NOTE — Progress Notes (Signed)
Subjective:     Patient ID: Caroline Hoffman, female   DOB: 1961/02/02, 51 y.o.   MRN: [id number]  Arm Pain   Ankle Pain   Wrist Pain  Pertinent negatives include no fever.   The patient is a 51 year old female with past medical history of fibromyalgia, hypertension. She presents for today's acute visit with having some wrist and shoulder pain the pain does start in her neck area and radiate down her shoulders to her hands. She continues to walk her medium sized dog although she does hurt a lot the next day. A neighbor lets her use their fenced in yard so the dog gets enough exercise and outdoor time. She is still taking her Cymbalta and using Xanax for anxiety. She states that she does not wish to seek counseling at this time. She wouls like to get more exercise but is unable to do typical exercises without having a lot of pain afterwards. We discussed water aerobics at today's visit.   Review of Systems  Constitutional: Positive for activity change. Negative for fever, chills, diaphoresis, appetite change, fatigue and unexpected weight change.  HENT: Negative.  Negative for neck pain and neck stiffness.   Respiratory: Negative.   Cardiovascular: Negative.   Gastrointestinal: Negative.   Musculoskeletal: Positive for myalgias and arthralgias. Negative for back pain, joint swelling and gait problem.  Skin: Negative.   Neurological: Negative.   Psychiatric/Behavioral: Negative.       Objective:   Physical Exam  Constitutional: She is oriented to person, place, and time. She appears well-developed and well-nourished.  HENT:  Head: Normocephalic and atraumatic.  Eyes: EOM are normal.  Neck: Normal range of motion. Neck supple.  Cardiovascular: Normal rate and regular rhythm.   Pulmonary/Chest: Effort normal and breath sounds normal.  Abdominal: Soft. Bowel sounds are normal.  Musculoskeletal: Normal range of motion. She exhibits tenderness. She exhibits no edema.       Diffuse  muscular tenderness to palpation and some point tenderness.   Neurological: She is alert and oriented to person, place, and time.  Skin: Skin is warm and dry.  Psychiatric:       Speech not as pressured as previously but still some anxiety in her voice.        Assessment/Plan:   1. Please see problem oriented charting.   2. Disposition- We will see the patient back in 6 months to check on her pain. She was given scholarship form for the [employer] to start water aerobics. Refills were given for her xanax, flexeril, cymbalta at today's visit. Will plan for PAP at next visit.

## 2012-08-29 NOTE — Assessment & Plan Note (Signed)
Patient continues to have some anxiety and pain after activity but seems to be slightly more functional than at last visit. Will continue cymbalta and xanax and will advise some water aerobics.

## 2012-08-29 NOTE — Telephone Encounter (Signed)
Pharmacy called needs to check on [facility] calcium and Vit D - this only has 600  mg calcium and Vit D 800mg  or they have [facility] plus that has the dosage you want but has magnesium, zinc and copper. Stanton Kidney Cambreigh Dearing RN 08/29/12 4PM

## 2012-08-31 NOTE — Telephone Encounter (Signed)
Talked with pharmacy [phone] - lower dose [facility] calcium and Vit D per Dr Dorise Hiss.

## 2012-08-31 NOTE — Telephone Encounter (Signed)
The lower dosage is fine.  Thanks,  Dr. Dorise Hiss

## 2013-05-19 ENCOUNTER — Encounter: Payer: Medicare Other | Admitting: Internal Medicine

## 2013-05-19 ENCOUNTER — Encounter: Payer: Self-pay | Admitting: Internal Medicine

## 2013-08-03 ENCOUNTER — Encounter: Payer: Self-pay | Admitting: Internal Medicine

## 2013-08-29 ENCOUNTER — Other Ambulatory Visit: Payer: Self-pay | Admitting: Internal Medicine

## 2014-03-02 ENCOUNTER — Other Ambulatory Visit: Payer: Self-pay | Admitting: Internal Medicine

## 2014-04-03 ENCOUNTER — Other Ambulatory Visit: Payer: Self-pay | Admitting: Internal Medicine

## 2014-04-04 ENCOUNTER — Encounter: Payer: Self-pay | Admitting: Internal Medicine

## 2014-04-04 NOTE — Telephone Encounter (Signed)
Last seen 12/13. Needs F/U in next 60 days in order to get additional refills. PCP if possible

## 2014-04-10 ENCOUNTER — Telehealth: Payer: Self-pay | Admitting: *Deleted

## 2014-04-10 NOTE — Telephone Encounter (Signed)
Person calls and leaves message that she needs to have her losartan discontinued and something safer ordered, states she read the side effects of losartan and this is not a safe drug, it causes headaches and she has headaches. She did not leave a birthdate or any identifying information, i tried to call the ph# back and the person on vmail was an Jalisa young. A letter has been sent to pt, she did state that dr Doug Sou was her doctor so i feel reassured that this is the pt, i will continue to call the ph# left

## 2014-04-11 ENCOUNTER — Ambulatory Visit (INDEPENDENT_AMBULATORY_CARE_PROVIDER_SITE_OTHER): Payer: 59 | Admitting: Internal Medicine

## 2014-04-11 ENCOUNTER — Encounter: Payer: Self-pay | Admitting: Internal Medicine

## 2014-04-11 VITALS — BP 133/84 | HR 56 | Ht 68.0 in | Wt 199.3 lb

## 2014-04-11 DIAGNOSIS — IMO0001 Reserved for inherently not codable concepts without codable children: Secondary | ICD-10-CM

## 2014-04-11 DIAGNOSIS — F32A Depression, unspecified: Secondary | ICD-10-CM

## 2014-04-11 DIAGNOSIS — F3289 Other specified depressive episodes: Secondary | ICD-10-CM

## 2014-04-11 DIAGNOSIS — I8390 Asymptomatic varicose veins of unspecified lower extremity: Secondary | ICD-10-CM

## 2014-04-11 DIAGNOSIS — Z0000 Encounter for general adult medical examination without abnormal findings: Secondary | ICD-10-CM

## 2014-04-11 DIAGNOSIS — I10 Essential (primary) hypertension: Secondary | ICD-10-CM

## 2014-04-11 DIAGNOSIS — F329 Major depressive disorder, single episode, unspecified: Secondary | ICD-10-CM

## 2014-04-11 DIAGNOSIS — M797 Fibromyalgia: Secondary | ICD-10-CM

## 2014-04-11 DIAGNOSIS — F411 Generalized anxiety disorder: Secondary | ICD-10-CM

## 2014-04-11 LAB — COMPLETE METABOLIC PANEL WITH GFR
ALT: 8 U/L (ref 0–35)
AST: 16 U/L (ref 0–37)
Albumin: 4.2 g/dL (ref 3.5–5.2)
Alkaline Phosphatase: 68 U/L (ref 39–117)
BUN: 10 mg/dL (ref 6–23)
CALCIUM: 9.1 mg/dL (ref 8.4–10.5)
CO2: 27 meq/L (ref 19–32)
CREATININE: 1 mg/dL (ref 0.50–1.10)
Chloride: 105 mEq/L (ref 96–112)
GFR, EST AFRICAN AMERICAN: 74 mL/min
GFR, EST NON AFRICAN AMERICAN: 64 mL/min
Glucose, Bld: 83 mg/dL (ref 70–99)
POTASSIUM: 4 meq/L (ref 3.5–5.3)
Sodium: 140 mEq/L (ref 135–145)
Total Bilirubin: 0.6 mg/dL (ref 0.2–1.2)
Total Protein: 7.2 g/dL (ref 6.0–8.3)

## 2014-04-11 LAB — CBC WITH DIFFERENTIAL/PLATELET
Basophils Absolute: 0.1 10*3/uL (ref 0.0–0.1)
Basophils Relative: 1 % (ref 0–1)
Eosinophils Absolute: 0.2 10*3/uL (ref 0.0–0.7)
Eosinophils Relative: 3 % (ref 0–5)
HEMATOCRIT: 39.8 % (ref 36.0–46.0)
HEMOGLOBIN: 13.8 g/dL (ref 12.0–15.0)
LYMPHS ABS: 1.6 10*3/uL (ref 0.7–4.0)
Lymphocytes Relative: 27 % (ref 12–46)
MCH: 28.6 pg (ref 26.0–34.0)
MCHC: 34.7 g/dL (ref 30.0–36.0)
MCV: 82.4 fL (ref 78.0–100.0)
MONO ABS: 0.3 10*3/uL (ref 0.1–1.0)
Monocytes Relative: 6 % (ref 3–12)
NEUTROS PCT: 63 % (ref 43–77)
Neutro Abs: 3.7 10*3/uL (ref 1.7–7.7)
Platelets: 286 10*3/uL (ref 150–400)
RBC: 4.83 MIL/uL (ref 3.87–5.11)
RDW: 14.2 % (ref 11.5–15.5)
WBC: 5.8 10*3/uL (ref 4.0–10.5)

## 2014-04-11 LAB — LIPID PANEL
CHOLESTEROL: 186 mg/dL (ref 0–200)
HDL: 62 mg/dL (ref >39)
LDL Cholesterol: 107 mg/dL — ABNORMAL HIGH (ref 0–99)
TRIGLYCERIDES: 84 mg/dL (ref <150)
Total CHOL/HDL Ratio: 3 Ratio
VLDL: 17 mg/dL (ref 0–40)

## 2014-04-11 MED ORDER — GABAPENTIN 300 MG PO CAPS: 300.0000 mg | ORAL_CAPSULE | Freq: Two times a day (BID) | ORAL | Status: AC

## 2014-04-11 MED ORDER — CYCLOBENZAPRINE HCL 5 MG PO TABS: 5.0000 mg | ORAL_TABLET | Freq: Three times a day (TID) | ORAL | Status: AC | PRN

## 2014-04-11 MED ORDER — GABAPENTIN 300 MG PO CAPS: 300.0000 mg | ORAL_CAPSULE | Freq: Three times a day (TID) | ORAL | Status: DC

## 2014-04-11 MED ORDER — AMLODIPINE BESYLATE 5 MG PO TABS: 5.0000 mg | ORAL_TABLET | Freq: Every day | ORAL | Status: DC

## 2014-04-11 MED ORDER — DULOXETINE HCL 60 MG PO CPEP: 60.0000 mg | ORAL_CAPSULE | Freq: Every day | ORAL | Status: AC

## 2014-04-11 MED ORDER — ALPRAZOLAM 0.5 MG PO TABS: 0.5000 mg | ORAL_TABLET | Freq: Every day | ORAL | Status: AC | PRN

## 2014-04-11 MED ORDER — CALCIUM CARBONATE-VITAMIN D 600-400 MG-UNIT PO TABS: 1.0000 | ORAL_TABLET | Freq: Every day | ORAL | Status: AC

## 2014-04-11 NOTE — Assessment & Plan Note (Signed)
Rx refill Cymbalta

## 2014-04-11 NOTE — Assessment & Plan Note (Signed)
Noted on upper thighs, spider veins and varicose  disc'ed compression stockings  Can be referred to vascular clinic/vein clinic once insurance card gets corrected

## 2014-04-11 NOTE — Assessment & Plan Note (Signed)
Referred for mammogram, pap smear done today with cytology, wet prep, GC/C Checked CMET, lipid, CBC, HA1C today

## 2014-04-11 NOTE — Assessment & Plan Note (Addendum)
BP Readings from Last 3 Encounters:  04/11/14 133/84  08/26/12 120/70  02/05/12 145/96    Lab Results  Component Value Date   NA 142 06/10/2010   K 4.0 06/10/2010   CREATININE 1.03 06/10/2010    Assessment: Blood pressure control: controlled Progress toward BP goal:  at goal Comments: none  Plan: Medications:  will d/c Losartan as pt thinks she is having side effects and put on Norvasc 5 mg qd  Educational resources provided: brochure Self management tools provided: no Other plans: check BMET, lipid panel today, f/u in 1 month

## 2014-04-11 NOTE — Progress Notes (Signed)
Attending physician note: Presenting problems, physical findings, medication review, assessment, and plan, reviewed with resident physician Dr. Lenise Arena and I concur with her findings as recorded above. Murriel Hopper M.D., [place]

## 2014-04-11 NOTE — Assessment & Plan Note (Signed)
Stable  Rx refill of Cymbalta 60 mg qd, Flexeril 5 mg tid prn, Neurontin 300 mg bid

## 2014-04-11 NOTE — Assessment & Plan Note (Signed)
Rx refill of Xanax 0.5 mg qd prn

## 2014-04-11 NOTE — Patient Instructions (Signed)
General Instructions: Please follow up in 1 month if blood pressure >140/90 otherwise in 4-6 months  Take care  Read info below about new blood pressure medication    Treatment Goals:  Goals (1 Years of Data) as of 04/11/14         As of Today 08/26/12     Blood Pressure    . Blood Pressure < 140/90  133/84 120/70      Progress Toward Treatment Goals:  Treatment Goal 04/11/2014  Blood pressure at goal    Self Care Goals & Plans:  Self Care Goal 04/11/2014  Manage my medications take my medicines as prescribed; bring my medications to every visit; refill my medications on time  Monitor my health keep track of my blood pressure  Eat healthy foods drink diet soda or water instead of juice or soda; eat more vegetables; eat foods that are low in salt; eat baked foods instead of fried foods; eat fruit for snacks and desserts; eat smaller portions  Be physically active -  Meeting treatment goals maintain the current self-care plan    No flowsheet data found.   Care Management & Community Referrals:  Referral 04/11/2014  Referrals made for care management support -  Referrals made to community resources none      Amlodipine tablets What is this medicine? AMLODIPINE (am LOE di peen) is a calcium-channel blocker. It affects the amount of calcium found in your heart and muscle cells. This relaxes your blood vessels, which can reduce the amount of work the heart has to do. This medicine is used to lower high blood pressure. It is also used to prevent chest pain. This medicine may be used for other purposes; ask your health care provider or pharmacist if you have questions. COMMON BRAND NAME(S): Norvasc What should I tell my health care provider before I take this medicine? They need to know if you have any of these conditions: -heart problems like heart failure or aortic stenosis -liver disease -an unusual or allergic reaction to amlodipine, other medicines, foods, dyes, or  preservatives -pregnant or trying to get pregnant -breast-feeding How should I use this medicine? Take this medicine by mouth with a glass of water. Follow the directions on the prescription label. Take your medicine at regular intervals. Do not take more medicine than directed. Talk to your pediatrician regarding the use of this medicine in children. Special care may be needed. This medicine has been used in children as young as 6. Persons over 1 years old may have a stronger reaction to this medicine and need smaller doses. Overdosage: If you think you have taken too much of this medicine contact a poison control center or emergency room at once. NOTE: This medicine is only for you. Do not share this medicine with others. What if I miss a dose? If you miss a dose, take it as soon as you can. If it is almost time for your next dose, take only that dose. Do not take double or extra doses. What may interact with this medicine? -herbal or dietary supplements -local or general anesthetics -medicines for high blood pressure -medicines for prostate problems -rifampin This list may not describe all possible interactions. Give your health care provider a list of all the medicines, herbs, non-prescription drugs, or dietary supplements you use. Also tell them if you smoke, drink alcohol, or use illegal drugs. Some items may interact with your medicine. What should I watch for while using this medicine? Visit your doctor  or health care professional for regular check ups. Check your blood pressure and pulse rate regularly. Ask your health care professional what your blood pressure and pulse rate should be, and when you should contact him or her. This medicine may make you feel confused, dizzy or lightheaded. Do not drive, use machinery, or do anything that needs mental alertness until you know how this medicine affects you. To reduce the risk of dizzy or fainting spells, do not sit or stand up quickly,  especially if you are an older patient. Avoid alcoholic drinks; they can make you more dizzy. Do not suddenly stop taking amlodipine. Ask your doctor or health care professional how you can gradually reduce the dose. What side effects may I notice from receiving this medicine? Side effects that you should report to your doctor or health care professional as soon as possible: -allergic reactions like skin rash, itching or hives, swelling of the face, lips, or tongue -breathing problems -changes in vision or hearing -chest pain -fast, irregular heartbeat -swelling of legs or ankles Side effects that usually do not require medical attention (report to your doctor or health care professional if they continue or are bothersome): -dry mouth -facial flushing -nausea, vomiting -stomach gas, pain -tired, weak -trouble sleeping This list may not describe all possible side effects. Call your doctor for medical advice about side effects. You may report side effects to FDA at 1-800-FDA-1088. Where should I keep my medicine? Keep out of the reach of children. Store at room temperature between 59 and 86 degrees F (15 and 30 degrees C). Protect from light. Keep container tightly closed. Throw away any unused medicine after the expiration date. NOTE: This sheet is a summary. It may not cover all possible information. If you have questions about this medicine, talk to your doctor, pharmacist, or health care provider.  2015, Elsevier/Gold Standard. (2012-08-05 11:40:58)

## 2014-04-11 NOTE — Progress Notes (Signed)
   Subjective:    Patient ID: Caroline Hoffman, female    DOB: 1961-08-17, 53 y.o.   MRN: [id number]  HPI Comments: 53 y.o PMH anxiety/depression, fibroids s/p uterine artery ligation at [facility], fibromyalgia, HTN, hyperhidrosis  She presents for annual f/u  1. HTN-controlled 133/84 though she is taking Cozaar 25 mg qd. She thinks she is having a side effect of the medication i.e h/a and she read the bottle which says dont take if you are allergic to sulfa and she has a severe allergy to sulfa.  She would like the medication to be changed.  2. Fibromyalgia-worse with activity but overall controlled with cymbalta and she needs Rx refills also of Flexeril, Neurontin 3. Anxiety-needs Rx refills of Xanax 0.5 mg daily prn.   4. She c/o varicose veins on leg being bothersome i.e in appearance and intermittently painful.     HM: due for pap smear, mammogram     Review of Systems  Respiratory: Negative for shortness of breath.   Cardiovascular: Negative for chest pain.  Gastrointestinal: Negative for constipation.  Hematological: Bruises/bleeds easily.       Objective:   Physical Exam  Nursing note and vitals reviewed. Constitutional: She is oriented to person, place, and time. Vital signs are normal. She appears well-developed and well-nourished. She is cooperative. No distress.  HENT:  Head: Normocephalic and atraumatic.  Mouth/Throat: Oropharynx is clear and moist and mucous membranes are normal. No oropharyngeal exudate.  Eyes: Conjunctivae are normal. Pupils are equal, round, and reactive to light. Right eye exhibits no discharge. Left eye exhibits no discharge. No scleral icterus.  Cardiovascular: Normal rate, regular rhythm, S1 normal, S2 normal and normal heart sounds.   No murmur heard. No lower ext edema   Pulmonary/Chest: Effort normal and breath sounds normal. No respiratory distress. She has no wheezes.  Abdominal: Soft. Bowel sounds are normal. There is no tenderness.    Genitourinary: Cervix exhibits friability. Cervix exhibits no motion tenderness and no discharge.    Neurological: She is alert and oriented to person, place, and time. Gait normal.  Skin: Skin is warm and dry. No rash noted. She is not diaphoretic.  Angiomas to arms b/l and abdomen Varicose and spider veins noted to thighs  Psychiatric: She has a normal mood and affect. Her speech is normal and behavior is normal. Judgment and thought content normal. Cognition and memory are normal.          Assessment & Plan:  F/u in 1 month due to BP med change, then 4-6 months

## 2014-04-12 ENCOUNTER — Encounter: Payer: Self-pay | Admitting: Internal Medicine

## 2014-04-12 LAB — HEMOGLOBIN A1C
HEMOGLOBIN A1C: 5.9 % — AB (ref <5.7)
Mean Plasma Glucose: 123 mg/dL — ABNORMAL HIGH (ref <117)

## 2014-04-12 LAB — CYTOLOGY - PAP

## 2014-04-13 ENCOUNTER — Encounter: Payer: Self-pay | Admitting: Internal Medicine

## 2014-04-26 ENCOUNTER — Other Ambulatory Visit: Payer: Self-pay | Admitting: Internal Medicine

## 2014-04-26 ENCOUNTER — Telehealth: Payer: Self-pay | Admitting: Internal Medicine

## 2014-04-26 MED ORDER — OXYCODONE-ACETAMINOPHEN 5-325 MG PO TABS: 1.0000 | ORAL_TABLET | Freq: Two times a day (BID) | ORAL | Status: AC | PRN

## 2014-04-26 MED ORDER — OXYCODONE-ACETAMINOPHEN 5-325 MG PO TABS: 1.0000 | ORAL_TABLET | Freq: Two times a day (BID) | ORAL | Status: DC | PRN

## 2014-04-26 NOTE — Telephone Encounter (Signed)
Pt calls and she has h/o fibromyalgia.  X 3 weeks plus 2 days after seeing me she started having neck, arm, back spasms and pain hurting worse with humidity.  Cymbalta 60 mg qd, Gapapentin, Flexeril 5 bid to tid prn is not helping with spasms/pain.  Lifting clothes detergent or cleaning or movement aggravates pain.  She states she cant take Aleve b/c she heard iy causes stroke, MI, kidney problems per what she has seen on TV and she was recently eating Aleve like candy w/o relief.  She cant take Mobic/Tramadol due to itching.  She cant take Vicodin.  She has taken Percocet with tolerability in the past.  Previous cervical/thoracic MRI 2010 imaging with mild rightward disc bulging at T4-5 without significant stenosis.Mild central and right foraminal narrowing at C5-6. Mild central and bilateral foraminal narrowing at C6-7.   Initially advised triage that pt needs to be seen but patient states she lives in [place] needs something immediately to help with relief of sx's and was just seen with me weeks ago.    Will give temporary Rx for pain medication (percocet).  Advised if pain is is significantly worse go to ED in [place] or come to office for visit (in [employer]) or establish MD in [place] as well (she has appt 05/10/14 with doctor in [place]). She will go to [facility] on [address] in [place], [place] MD

## 2014-05-22 ENCOUNTER — Telehealth: Payer: Self-pay | Admitting: *Deleted

## 2014-05-22 NOTE — Telephone Encounter (Signed)
Message left on patients cell phone number that a Mammogram has been scheduled for her on 05/31/2014 at 1:30 PM to arrive by 1:15 PM at the [place] to call Clinics to verify message and appointment time.  Sander Nephew, RN 05/22/2014 9:35 AM.

## 2014-05-31 ENCOUNTER — Ambulatory Visit (HOSPITAL_COMMUNITY): Payer: 59

## 2014-06-12 ENCOUNTER — Other Ambulatory Visit: Payer: Self-pay | Admitting: Internal Medicine

## 2014-06-12 DIAGNOSIS — I10 Essential (primary) hypertension: Secondary | ICD-10-CM

## 2014-06-14 ENCOUNTER — Other Ambulatory Visit: Payer: Self-pay | Admitting: Internal Medicine

## 2014-07-18 NOTE — Telephone Encounter (Signed)
Message sent to front office to schedule pt an appt. 

## 2014-07-24 ENCOUNTER — Encounter: Payer: Self-pay | Admitting: Internal Medicine

## 2014-10-04 ENCOUNTER — Encounter: Payer: Self-pay | Admitting: Internal Medicine

## 2015-01-28 ENCOUNTER — Encounter: Payer: Self-pay | Admitting: *Deleted
